# Patient Record
Sex: Female | Born: 1985 | Race: White | Hispanic: No | Marital: Single | Smoking: Never smoker
Health system: Southern US, Community
[De-identification: ages and names within clinical notes are randomized; demographics above are authoritative.]

## PROBLEM LIST (undated history)

## (undated) DIAGNOSIS — I10 Essential (primary) hypertension: Secondary | ICD-10-CM

## (undated) DIAGNOSIS — T7840XA Allergy, unspecified, initial encounter: Secondary | ICD-10-CM

## (undated) HISTORY — DX: Allergy, unspecified, initial encounter: T78.40XA

## (undated) HISTORY — DX: Essential (primary) hypertension: I10

## (undated) HISTORY — PX: FOOT SURGERY: SHX648

## (undated) HISTORY — PX: WRIST SURGERY: SHX841

---

## 2012-10-01 DIAGNOSIS — E669 Obesity, unspecified: Secondary | ICD-10-CM | POA: Insufficient documentation

## 2012-10-01 DIAGNOSIS — Z6841 Body Mass Index (BMI) 40.0 and over, adult: Secondary | ICD-10-CM | POA: Insufficient documentation

## 2017-09-28 ENCOUNTER — Encounter: Payer: Self-pay | Admitting: Physician Assistant

## 2017-09-28 ENCOUNTER — Ambulatory Visit: Payer: Self-pay | Admitting: Physician Assistant

## 2017-09-28 VITALS — BP 119/80 | HR 96 | Temp 98.5°F | Resp 16

## 2017-09-28 DIAGNOSIS — J0100 Acute maxillary sinusitis, unspecified: Secondary | ICD-10-CM

## 2017-09-28 MED ORDER — AZITHROMYCIN 250 MG PO TABS: ORAL_TABLET | ORAL | 0 refills | Status: DC

## 2017-09-28 NOTE — Progress Notes (Signed)
S: Complains of cough and sinus pain for 2 weeks, states she has started film will better but now symptoms have returned. States mucus is dark green. Denies fever or chills, denies chest pain or shortness of breath, did use over-the-counter NyQuil and DayQuil  O: Vitals are normal, TMs are dull, nasal mucosa is red and swollen, throat is normal, neck is supple, no lymphadenopathy noted, lungs are clear to auscultation, heart sounds are normal  A: Acute sinusitis  P: Z-Pak

## 2018-09-20 ENCOUNTER — Ambulatory Visit: Payer: Self-pay | Admitting: Family Medicine

## 2018-09-20 VITALS — BP 128/82 | HR 75 | Temp 98.6°F | Resp 12 | Ht 64.0 in | Wt 230.4 lb

## 2018-09-20 DIAGNOSIS — J069 Acute upper respiratory infection, unspecified: Secondary | ICD-10-CM

## 2018-09-20 DIAGNOSIS — J309 Allergic rhinitis, unspecified: Secondary | ICD-10-CM

## 2018-09-20 MED ORDER — FLUTICASONE PROPIONATE 50 MCG/ACT NA SUSP: 2.0000 | Freq: Every day | NASAL | 0 refills | Status: AC

## 2018-09-20 NOTE — Progress Notes (Signed)
Subjective: Congestion     Maureen Reed is a 32 y.o. female who presents for evaluation of nasal congestion, nonproductive cough, mild fatigue, sneezing, and sore throat for 5 days.  Reports symptoms have been gradually improving since the onset.  Patient denies any medical history other than allergic rhinitis or taking any immunosuppressive medications.  Patient takes an over-the-counter antihistamine as needed for allergies.  Patient denies any difficulty sleeping at night due to cough. Treatment to date: Over-the-counter cold medicine.  Denies rash, nausea, vomiting, diarrhea, SOB, wheezing, chest or back pain, ear pain, difficulty swallowing, confusion, anosmia/hyposmia, dental pain, facial pressure/unilateral facial pain, pain exacerbated by bending over, headache, body aches, fever, chills, severe symptoms, or initial improvement and then worsening of symptoms. History of smoking, asthma, COPD: Negative. History of recurrent sinus and/or lung infections: Negative.  Review of Systems Pertinent items noted in HPI and remainder of comprehensive ROS otherwise negative.     Objective:   Physical Exam General: Awake, alert, and oriented. No acute distress. Well developed, hydrated and nourished. Appears stated age. Nontoxic appearance.  HEENT:  PND noted.  No erythema to posterior oropharynx.  No edema or exudates of pharynx or tonsils. No erythema or bulging of TM.  Mild erythema/edema to nasal mucosa. Sinuses nontender. Supple neck without adenopathy. Cardiac: Heart rate and rhythm are normal. No murmurs, gallops, or rubs are auscultated. S1 and S2 are heard and are of normal intensity.  Respiratory: No signs of respiratory distress. Lungs clear. No tachypnea. Able to speak in full sentences without dyspnea. Nonlabored respirations.  Skin: Skin is warm, dry and intact. Appropriate color for ethnicity. No cyanosis noted.    Assessment:    viral upper respiratory illness   Plan:    Discussed diagnosis and treatment of URI. Discussed the importance of avoiding unnecessary antibiotic therapy. Suggested symptomatic OTC remedies. Nasal saline spray for congestion.   Prescribed Flonase.  Discussed instructions for use and side/adverse effects.  Patient denies any contraindications to use or ocular history. Discussed red flag symptoms and circumstances with which to seek medical care.   New Prescriptions   FLUTICASONE (FLONASE) 50 MCG/ACT NASAL SPRAY    Place 2 sprays into both nostrils daily.

## 2019-03-07 DIAGNOSIS — S52502A Unspecified fracture of the lower end of left radius, initial encounter for closed fracture: Secondary | ICD-10-CM | POA: Insufficient documentation

## 2019-08-25 ENCOUNTER — Other Ambulatory Visit: Payer: Self-pay

## 2019-08-25 DIAGNOSIS — Z20822 Contact with and (suspected) exposure to covid-19: Secondary | ICD-10-CM

## 2019-08-26 LAB — NOVEL CORONAVIRUS, NAA: SARS-CoV-2, NAA: NOT DETECTED

## 2020-03-16 ENCOUNTER — Encounter: Payer: Self-pay | Admitting: Nurse Practitioner

## 2020-03-16 ENCOUNTER — Other Ambulatory Visit: Payer: Self-pay

## 2020-03-16 ENCOUNTER — Ambulatory Visit: Payer: Managed Care, Other (non HMO) | Admitting: Nurse Practitioner

## 2020-03-16 VITALS — BP 122/82 | HR 72 | Temp 97.0°F | Resp 18 | Ht 62.0 in | Wt 234.0 lb

## 2020-03-16 DIAGNOSIS — Z008 Encounter for other general examination: Secondary | ICD-10-CM

## 2020-03-16 NOTE — Progress Notes (Signed)
  Subjective:     [name], female   DOB: Mar 16, 1986, 34 y.o.   MRN: [id number]  HPI  Maureen Reed is a 34 y.o. w/f who presents to the [facility] for her biometric screening exam. She is employed in [employer] and reports she enjoys her job and it is not stressful.   PMH: Seasonal allergies, obesity Surgeries: Left wrist 2021 (fracture) SH: Denies use of tobacco or drugs, occasional ETOH use, employee reports no regular exercise program [place].   GYN: Last Pap 5/20 and was normal  Immunizations: UTD, has had Covid vaccine  Meds: Yasmin, Flonase, Mycolog Allergies: NKDA  Review of Systems  HENT:       Seasonal allergies.   All other systems reviewed and are negative.  BP 122/82 (BP Location: Left Arm, Patient Position: Sitting, Cuff Size: Large)   Pulse 72   Temp (!) 97 F (36.1 C) (Temporal)   Resp 18   Ht 5\' 2"  (1.575 m)   Wt 234 lb (106.1 kg)   SpO2 99%   BMI 42.80 kg/m      Objective:   Physical Exam Vitals and nursing note reviewed.  Constitutional:      Appearance: Normal appearance. She is obese.  HENT:     Head: Normocephalic.     Right Ear: Tympanic membrane, ear canal and external ear normal.     Left Ear: Tympanic membrane, ear canal and external ear normal.     Nose: Nose normal.     Mouth/Throat:     Mouth: Mucous membranes are moist.     Pharynx: Oropharynx is clear.  Eyes:     Conjunctiva/sclera: Conjunctivae normal.  Cardiovascular:     Rate and Rhythm: Normal rate and regular rhythm.     Pulses: Normal pulses.  Pulmonary:     Effort: Pulmonary effort is normal.     Breath sounds: Normal breath sounds.  Musculoskeletal:        General: Normal range of motion.     Cervical back: Neck supple.  Skin:    General: Skin is warm and dry.  Neurological:     Mental Status: She is alert.     Motor: Motor function is intact.     Coordination: Coordination is intact.     Gait: Gait is  intact.     Deep Tendon Reflexes:     Reflex Scores:      Bicep reflexes are 2+ on the right side and 2+ on the left side.      Brachioradialis reflexes are 2+ on the right side and 2+ on the left side.      Patellar reflexes are 2+ on the right side and 2+ on the left side.    Comments: Grips are equal, stands on one foot without difficulty.        Assessment:    34 y.o. obese female here for her biometric screening.   No problems noted today. Normal limited exam.  Plan:     Discussed with the employee importance of healthy diet and exercise. Written and verbal instructions given. Employee given opportunity to ask question. She will continue regular care with her PCP and GYN. She will RTC as needed.

## 2020-03-16 NOTE — Patient Instructions (Addendum)
Healthy Eating Following a healthy eating pattern may help you to achieve and maintain a healthy body weight, reduce the risk of chronic disease, and live a long and productive life. It is important to follow a healthy eating pattern at an appropriate calorie level for your body. Your nutritional needs should be met primarily through food by choosing a variety of nutrient-rich foods. What are tips for following this plan? Reading food labels  Read labels and choose the following: ? Reduced or low sodium. ? Juices with 100% fruit juice. ? Foods with low saturated fats and high polyunsaturated and monounsaturated fats. ? Foods with whole grains, such as whole wheat, cracked wheat, brown rice, and wild rice. ? Whole grains that are fortified with folic acid. This is recommended for women who are pregnant or who want to become pregnant.  Read labels and avoid the following: ? Foods with a lot of added sugars. These include foods that contain brown sugar, corn sweetener, corn syrup, dextrose, fructose, glucose, high-fructose corn syrup, honey, invert sugar, lactose, malt syrup, maltose, molasses, raw sugar, sucrose, trehalose, or turbinado sugar.  Do not eat more than the following amounts of added sugar per day:  6 teaspoons (25 g) for women.  9 teaspoons (38 g) for men. ? Foods that contain processed or refined starches and grains. ? Refined grain products, such as white flour, degermed cornmeal, white bread, and white rice. Shopping  Choose nutrient-rich snacks, such as vegetables, whole fruits, and nuts. Avoid high-calorie and high-sugar snacks, such as potato chips, fruit snacks, and candy.  Use oil-based dressings and spreads on foods instead of solid fats such as butter, stick margarine, or cream cheese.  Limit pre-made sauces, mixes, and "instant" products such as flavored rice, instant noodles, and ready-made pasta.  Try more plant-protein sources, such as tofu, tempeh, black beans,  edamame, lentils, nuts, and seeds.  Explore eating plans such as the Mediterranean diet or vegetarian diet. Cooking  Use oil to saut or stir-fry foods instead of solid fats such as butter, stick margarine, or lard.  Try baking, boiling, grilling, or broiling instead of frying.  Remove the fatty part of meats before cooking.  Steam vegetables in water or broth. Meal planning   At meals, imagine dividing your plate into fourths: ? One-half of your plate is fruits and vegetables. ? One-fourth of your plate is whole grains. ? One-fourth of your plate is protein, especially lean meats, poultry, eggs, tofu, beans, or nuts.  Include low-fat dairy as part of your daily diet. Lifestyle  Choose healthy options in all settings, including home, work, school, restaurants, or stores.  Prepare your food safely: ? Wash your hands after handling raw meats. ? Keep food preparation surfaces clean by regularly washing with hot, soapy water. ? Keep raw meats separate from ready-to-eat foods, such as fruits and vegetables. ? Cook seafood, meat, poultry, and eggs to the recommended internal temperature. ? Store foods at safe temperatures. In general:  Keep cold foods at 59F (4.4C) or below.  Keep hot foods at 159F (60C) or above.  Keep your freezer at [facility] (-17.8C) or below.  Foods are no longer safe to eat when they have been between the temperatures of 40-159F (4.4-60C) for more than 2 hours. What foods should I eat? Fruits Aim to eat 2 cup-equivalents of fresh, canned (in natural juice), or frozen fruits each day. Examples of 1 cup-equivalent of fruit include 1 small apple, 8 large strawberries, 1 cup canned fruit,  cup  dried fruit, or 1 cup 100% juice. Vegetables Aim to eat 2-3 cup-equivalents of fresh and frozen vegetables each day, including different varieties and colors. Examples of 1 cup-equivalent of vegetables include 2 medium carrots, 2 cups raw, leafy greens, 1 cup chopped  vegetable (raw or cooked), or 1 medium baked potato. Grains Aim to eat 6 ounce-equivalents of whole grains each day. Examples of 1 ounce-equivalent of grains include 1 slice of bread, 1 cup ready-to-eat cereal, 3 cups popcorn, or  cup cooked rice, pasta, or cereal. Meats and other proteins Aim to eat 5-6 ounce-equivalents of protein each day. Examples of 1 ounce-equivalent of protein include 1 egg, 1/2 cup nuts or seeds, or 1 tablespoon (16 g) peanut butter. A cut of meat or fish that is the size of a deck of cards is about 3-4 ounce-equivalents.  Of the protein you eat each week, try to have at least 8 ounces come from seafood. This includes salmon, trout, herring, and anchovies. Dairy Aim to eat 3 cup-equivalents of fat-free or low-fat dairy each day. Examples of 1 cup-equivalent of dairy include 1 cup (240 mL) milk, 8 ounces (250 g) yogurt, 1 ounces (44 g) natural cheese, or 1 cup (240 mL) fortified soy milk. Fats and oils  Aim for about 5 teaspoons (21 g) per day. Choose monounsaturated fats, such as canola and olive oils, avocados, peanut butter, and most nuts, or polyunsaturated fats, such as sunflower, corn, and soybean oils, walnuts, pine nuts, sesame seeds, sunflower seeds, and flaxseed. Beverages  Aim for six 8-oz glasses of water per day. Limit coffee to three to five 8-oz cups per day.  Limit caffeinated beverages that have added calories, such as soda and energy drinks.  Limit alcohol intake to no more than 1 drink a day for nonpregnant women and 2 drinks a day for men. One drink equals 12 oz of beer (355 mL), 5 oz of wine (148 mL), or 1 oz of hard liquor (44 mL). Seasoning and other foods  Avoid adding excess amounts of salt to your foods. Try flavoring foods with herbs and spices instead of salt.  Avoid adding sugar to foods.  Try using oil-based dressings, sauces, and spreads instead of solid fats. This information is based on general [place] nutrition guidelines. For more  information, visit [employer]. Exact amounts may vary based on your nutrition needs. Summary  A healthy eating plan may help you to maintain a healthy weight, reduce the risk of chronic diseases, and stay active throughout your life.  Plan your meals. Make sure you eat the right portions of a variety of nutrient-rich foods.  Try baking, boiling, grilling, or broiling instead of frying.  Choose healthy options in all settings, including home, work, school, restaurants, or stores. This information is not intended to replace advice given to you by your health care provider. Make sure you discuss any questions you have with your health care provider. Document Revised: 02/11/2018 Document Reviewed: 02/11/2018 Elsevier Patient Education  [place]. Exercise Safely  Exercise does not have to hurt to be beneficial. Start easy and build up your effort. Exercise at times of the day when you feel your best. Do not hold your breath; remember to breathe out while working and breathe in while relaxing. May also count out loud while exercising. Exercise should not cause joint pain, but rather muscle fatigue.  Copyright  [employer]HI. All rights reserved.

## 2020-03-17 LAB — LIPID PANEL
Chol/HDL Ratio: 2.3 ratio (ref 0.0–4.4)
Cholesterol, Total: 150 mg/dL (ref 100–199)
HDL: 65 mg/dL (ref >39)
LDL Chol Calc (NIH): 69 mg/dL (ref 0–99)
Triglycerides: 84 mg/dL (ref 0–149)
VLDL Cholesterol Cal: 16 mg/dL (ref 5–40)

## 2020-03-17 LAB — GLUCOSE, RANDOM: Glucose: 92 mg/dL (ref 65–99)

## 2020-06-01 ENCOUNTER — Encounter: Payer: Self-pay | Admitting: Adult Health

## 2020-06-01 ENCOUNTER — Ambulatory Visit (INDEPENDENT_AMBULATORY_CARE_PROVIDER_SITE_OTHER): Payer: Managed Care, Other (non HMO) | Admitting: Adult Health

## 2020-06-01 ENCOUNTER — Other Ambulatory Visit: Payer: Self-pay

## 2020-06-01 VITALS — BP 130/80 | HR 92 | Temp 96.6°F | Resp 16 | Ht 62.5 in | Wt 233.6 lb

## 2020-06-01 DIAGNOSIS — Z0000 Encounter for general adult medical examination without abnormal findings: Secondary | ICD-10-CM | POA: Diagnosis not present

## 2020-06-01 DIAGNOSIS — Z6841 Body Mass Index (BMI) 40.0 and over, adult: Secondary | ICD-10-CM

## 2020-06-01 DIAGNOSIS — Z1389 Encounter for screening for other disorder: Secondary | ICD-10-CM

## 2020-06-01 DIAGNOSIS — E559 Vitamin D deficiency, unspecified: Secondary | ICD-10-CM | POA: Diagnosis not present

## 2020-06-01 DIAGNOSIS — N6310 Unspecified lump in the right breast, unspecified quadrant: Secondary | ICD-10-CM

## 2020-06-01 DIAGNOSIS — R21 Rash and other nonspecific skin eruption: Secondary | ICD-10-CM | POA: Diagnosis not present

## 2020-06-01 DIAGNOSIS — Z9889 Other specified postprocedural states: Secondary | ICD-10-CM

## 2020-06-01 DIAGNOSIS — Z803 Family history of malignant neoplasm of breast: Secondary | ICD-10-CM

## 2020-06-01 MED ORDER — KETOCONAZOLE 2 % EX CREA: 1.0000 "application " | TOPICAL_CREAM | Freq: Every day | CUTANEOUS | 0 refills | Status: DC

## 2020-06-01 NOTE — Patient Instructions (Addendum)
[facility] at [facility] [address] [place], [place] [address]  Main: [phone]     Health Maintenance, Female Adopting a healthy lifestyle and getting preventive care are important in promoting health and wellness. Ask your health care provider about:  The right schedule for you to have regular tests and exams.  Things you can do on your own to prevent diseases and keep yourself healthy. What should I know about diet, weight, and exercise? Eat a healthy diet   Eat a diet that includes plenty of vegetables, fruits, low-fat dairy products, and lean protein.  Do not eat a lot of foods that are high in solid fats, added sugars, or sodium. Maintain a healthy weight Body mass index (BMI) is used to identify weight problems. It estimates body fat based on height and weight. Your health care provider can help determine your BMI and help you achieve or maintain a healthy weight. Get regular exercise Get regular exercise. This is one of the most important things you can do for your health. Most adults should:  Exercise for at least 150 minutes each week. The exercise should increase your heart rate and make you sweat (moderate-intensity exercise).  Do strengthening exercises at least twice a week. This is in addition to the moderate-intensity exercise.  Spend less time sitting. Even light physical activity can be beneficial. Watch cholesterol and blood lipids Have your blood tested for lipids and cholesterol at 32 years of age, then have this test every 5 years. Have your cholesterol levels checked more often if:  Your lipid or cholesterol levels are high.  You are older than 32 years of age.  You are at high risk for heart disease. What should I know about cancer screening? Depending on your health history and family history, you may need to have cancer screening at various ages. This may include screening for:  Breast cancer.  Cervical  cancer.  Colorectal cancer.  Skin cancer.  Lung cancer. What should I know about heart disease, diabetes, and high blood pressure? Blood pressure and heart disease  High blood pressure causes heart disease and increases the risk of stroke. This is more likely to develop in people who have high blood pressure readings, are of African descent, or are overweight.  Have your blood pressure checked: ? Every 3-5 years if you are 78-41 years of age. ? Every year if you are 68 years old or older. Diabetes Have regular diabetes screenings. This checks your fasting blood sugar level. Have the screening done:  Once every three years after age 63 if you are at a normal weight and have a low risk for diabetes.  More often and at a younger age if you are overweight or have a high risk for diabetes. What should I know about preventing infection? Hepatitis B If you have a higher risk for hepatitis B, you should be screened for this virus. Talk with your health care provider to find out if you are at risk for hepatitis B infection. Hepatitis C Testing is recommended for:  Everyone born from [birth year] through 1963.  Anyone with known risk factors for hepatitis C. Sexually transmitted infections (STIs)  Get screened for STIs, including gonorrhea and chlamydia, if: ? You are sexually active and are younger than 32 years of age. ? You are older than 32 years of age and your health care provider tells you that you are at risk for this type of infection. ? Your sexual activity has  changed since you were last screened, and you are at increased risk for chlamydia or gonorrhea. Ask your health care provider if you are at risk.  Ask your health care provider about whether you are at high risk for HIV. Your health care provider may recommend a prescription medicine to help prevent HIV infection. If you choose to take medicine to prevent HIV, you should first get tested for HIV. You should then be tested every 3  months for as long as you are taking the medicine. Pregnancy  If you are about to stop having your period (premenopausal) and you may become pregnant, seek counseling before you get pregnant.  Take 400 to 800 micrograms (mcg) of folic acid every day if you become pregnant.  Ask for birth control (contraception) if you want to prevent pregnancy. Osteoporosis and menopause Osteoporosis is a disease in which the bones lose minerals and strength with aging. This can result in bone fractures. If you are 32 years old or older, or if you are at risk for osteoporosis and fractures, ask your health care provider if you should:  Be screened for bone loss.  Take a calcium or vitamin D supplement to lower your risk of fractures.  Be given hormone replacement therapy (HRT) to treat symptoms of menopause. Follow these instructions at home: Lifestyle  Do not use any products that contain nicotine or tobacco, such as cigarettes, e-cigarettes, and chewing tobacco. If you need help quitting, ask your health care provider.  Do not use street drugs.  Do not share needles.  Ask your health care provider for help if you need support or information about quitting drugs. Alcohol use  Do not drink alcohol if: ? Your health care provider tells you not to drink. ? You are pregnant, may be pregnant, or are planning to become pregnant.  If you drink alcohol: ? Limit how much you use to 0-1 drink a day. ? Limit intake if you are breastfeeding.  Be aware of how much alcohol is in your drink. In the [place] one drink equals one 12 oz bottle of beer (355 mL), one 5 oz glass of wine (148 mL), or one 1 oz glass of hard liquor (44 mL). General instructions  Schedule regular health, dental, and eye exams.  Stay current with your vaccines.  Tell your health care provider if: ? You often feel depressed. ? You have ever been abused or do not feel safe at home. Summary  Adopting a healthy lifestyle and getting  preventive care are important in promoting health and wellness.  Follow your health care provider's instructions about healthy diet, exercising, and getting tested or screened for diseases.  Follow your health care provider's instructions on monitoring your cholesterol and blood pressure. This information is not intended to replace advice given to you by your health care provider. Make sure you discuss any questions you have with your health care provider. Document Revised: 10/23/2018 Document Reviewed: 10/23/2018 Elsevier Patient Education  2020 Elsevier Inc.  Breast Self-Awareness Breast self-awareness is knowing how your breasts look and feel. Doing breast self-awareness is important. It allows you to catch a breast problem early while it is still small and can be treated. All women should do breast self-awareness, including women who have had breast implants. Tell your doctor if you notice a change in your breasts. What you need:  A mirror.  A well-lit room. How to do a breast self-exam A breast self-exam is one way to learn what is normal  for your breasts and to check for changes. To do a breast self-exam: Look for changes  1. Take off all the clothes above your waist. 2. Stand in front of a mirror in a room with good lighting. 3. Put your hands on your hips. 4. Push your hands down. 5. Look at your breasts and nipples in the mirror to see if one breast or nipple looks different from the other. Check to see if: ? The shape of one breast is different. ? The size of one breast is different. ? There are wrinkles, dips, and bumps in one breast and not the other. 6. Look at each breast for changes in the skin, such as: ? Redness. ? Scaly areas. 7. Look for changes in your nipples, such as: ? Liquid around the nipples. ? Bleeding. ? Dimpling. ? Redness. ? A change in where the nipples are. Feel for changes  1. Lie on your back on the floor. 2. Feel each breast. To do this,  follow these steps: ? Pick a breast to feel. ? Put the arm closest to that breast above your head. ? Use your other arm to feel the nipple area of your breast. Feel the area with the pads of your three middle fingers by making small circles with your fingers. For the first circle, press lightly. For the second circle, press harder. For the third circle, press even harder. ? Keep making circles with your fingers at the different pressures as you move down your breast. Stop when you feel your ribs. ? Move your fingers a little toward the center of your body. ? Start making circles with your fingers again, this time going up until you reach your collarbone. ? Keep making up-and-down circles until you reach your armpit. Remember to keep using the three pressures. ? Feel the other breast in the same way. 3. Sit or stand in the tub or shower. 4. With soapy water on your skin, feel each breast the same way you did in step 2 when you were lying on the floor. Write down what you find Writing down what you find can help you remember what to tell your doctor. Write down:  What is normal for each breast.  Any changes you find in each breast, including: ? The kind of changes you find. ? Whether you have pain. ? Size and location of any lumps.  When you last had your menstrual period. General tips  Check your breasts every month.  If you are breastfeeding, the best time to check your breasts is after you feed your baby or after you use a breast pump.  If you get menstrual periods, the best time to check your breasts is 5-7 days after your menstrual period is over.  With time, you will become comfortable with the self-exam, and you will begin to know if there are changes in your breasts. Contact a doctor if you:  See a change in the shape or size of your breasts or nipples.  See a change in the skin of your breast or nipples, such as red or scaly skin.  Have fluid coming from your nipples that  is not normal.  Find a lump or thick area that was not there before.  Have pain in your breasts.  Have any concerns about your breast health. Summary  Breast self-awareness includes looking for changes in your breasts, as well as feeling for changes within your breasts.  Breast self-awareness should be done in front of  a mirror in a well-lit room.  You should check your breasts every month. If you get menstrual periods, the best time to check your breasts is 5-7 days after your menstrual period is over.  Let your doctor know of any changes you see in your breasts, including changes in size, changes on the skin, pain or tenderness, or fluid from your nipples that is not normal. This information is not intended to replace advice given to you by your health care provider. Make sure you discuss any questions you have with your health care provider. Document Revised: 06/18/2018 Document Reviewed: 06/18/2018 Elsevier Patient Education  2020 Else[employer] Inc.   Calorie Counting for [employer] Calories are units of energy. Your body needs a certain amount of calories from food to keep you going throughout the day. When you eat more calories than your body needs, your body stores the extra calories as fat. When you eat fewer calories than your body needs, your body burns fat to get the energy it needs. Calorie counting means keeping track of how many calories you eat and drink each day. Calorie counting can be helpful if you need to lose weight. If you make sure to eat fewer calories than your body needs, you should lose weight. Ask your health care provider what a healthy weight is for you. For calorie counting to work, you will need to eat the right number of calories in a day in order to lose a healthy amount of weight per week. A dietitian can help you determine how many calories you need in a day and will give you suggestions on how to reach your calorie goal.  A healthy amount of weight to lose  per week is usually 1-2 lb (0.5-0.9 kg). This usually means that your daily calorie intake should be reduced by 500-750 calories.  Eating 1,200 - 1,500 calories per day can help most women lose weight.  Eating 1,500 - 1,800 calories per day can help most men lose weight. What is my plan? My goal is to have __________ calories per day. If I have this many calories per day, I should lose around __________ pounds per week. What do I need to know about calorie counting? In order to meet your daily calorie goal, you will need to:  Find out how many calories are in each food you would like to eat. Try to do this before you eat.  Decide how much of the food you plan to eat.  Write down what you ate and how many calories it had. Doing this is called keeping a food log. To successfully lose weight, it is important to balance calorie counting with a healthy lifestyle that includes regular activity. Aim for 150 minutes of moderate exercise (such as walking) or 75 minutes of vigorous exercise (such as running) each week. Where do I find calorie information?  The number of calories in a food can be found on a Nutrition Facts label. If a food does not have a Nutrition Facts label, try to look up the calories online or ask your dietitian for help. Remember that calories are listed per serving. If you choose to have more than one serving of a food, you will have to multiply the calories per serving by the amount of servings you plan to eat. For example, the label on a package of bread might say that a serving size is 1 slice and that there are 90 calories in a serving. If you eat 1 slice,  you will have eaten 90 calories. If you eat 2 slices, you will have eaten 180 calories. How do I keep a food log? Immediately after each meal, record the following information in your food log:  What you ate. Don't forget to include toppings, sauces, and other extras on the food.  How much you ate. This can be measured in  cups, ounces, or number of items.  How many calories each food and drink had.  The total number of calories in the meal. Keep your food log near you, such as in a small notebook in your pocket, or use a mobile app or website. Some programs will calculate calories for you and show you how many calories you have left for the day to meet your goal. What are some calorie counting tips?   Use your calories on foods and drinks that will fill you up and not leave you hungry: ? Some examples of foods that fill you up are nuts and nut butters, vegetables, lean proteins, and high-fiber foods like whole grains. High-fiber foods are foods with more than 5 g fiber per serving. ? Drinks such as sodas, specialty coffee drinks, alcohol, and juices have a lot of calories, yet do not fill you up.  Eat nutritious foods and avoid empty calories. Empty calories are calories you get from foods or beverages that do not have many vitamins or protein, such as candy, sweets, and soda. It is better to have a nutritious high-calorie food (such as an avocado) than a food with few nutrients (such as a bag of chips).  Know how many calories are in the foods you eat most often. This will help you calculate calorie counts faster.  Pay attention to calories in drinks. Low-calorie drinks include water and unsweetened drinks.  Pay attention to nutrition labels for "low fat" or "fat free" foods. These foods sometimes have the same amount of calories or more calories than the full fat versions. They also often have added sugar, starch, or salt, to make up for flavor that was removed with the fat.  Find a way of tracking calories that works for you. Get creative. Try different apps or programs if writing down calories does not work for you. What are some portion control tips?  Know how many calories are in a serving. This will help you know how many servings of a certain food you can have.  Use a measuring cup to measure serving  sizes. You could also try weighing out portions on a kitchen scale. With time, you will be able to estimate serving sizes for some foods.  Take some time to put servings of different foods on your favorite plates, bowls, and cups so you know what a serving looks like.  Try not to eat straight from a bag or box. Doing this can lead to overeating. Put the amount you would like to eat in a cup or on a plate to make sure you are eating the right portion.  Use smaller plates, glasses, and bowls to prevent overeating.  Try not to multitask (for example, watch TV or use your computer) while eating. If it is time to eat, sit down at a table and enjoy your food. This will help you to know when you are full. It will also help you to be aware of what you are eating and how much you are eating. What are tips for following this plan? Reading food labels  Check the calorie count compared to the  serving size. The serving size may be smaller than what you are used to eating.  Check the source of the calories. Make sure the food you are eating is high in vitamins and protein and low in saturated and trans fats. Shopping  Read nutrition labels while you shop. This will help you make healthy decisions before you decide to purchase your food.  Make a grocery list and stick to it. Cooking  Try to cook your favorite foods in a healthier way. For example, try baking instead of frying.  Use low-fat dairy products. Meal planning  Use more fruits and vegetables. Half of your plate should be fruits and vegetables.  Include lean proteins like poultry and fish. How do I count calories when eating out?  Ask for smaller portion sizes.  Consider sharing an entree and sides instead of getting your own entree.  If you get your own entree, eat only half. Ask for a box at the beginning of your meal and put the rest of your entree in it so you are not tempted to eat it.  If calories are listed on the menu, choose  the lower calorie options.  Choose dishes that include vegetables, fruits, whole grains, low-fat dairy products, and lean protein.  Choose items that are boiled, broiled, grilled, or steamed. Stay away from items that are buttered, battered, fried, or served with cream sauce. Items labeled "crispy" are usually fried, unless stated otherwise.  Choose water, low-fat milk, unsweetened iced tea, or other drinks without added sugar. If you want an alcoholic beverage, choose a lower calorie option such as a glass of wine or light beer.  Ask for dressings, sauces, and syrups on the side. These are usually high in calories, so you should limit the amount you eat.  If you want a salad, choose a garden salad and ask for grilled meats. Avoid extra toppings like bacon, cheese, or fried items. Ask for the dressing on the side, or ask for olive oil and vinegar or lemon to use as dressing.  Estimate how many servings of a food you are given. For example, a serving of cooked rice is  cup or about the size of half a baseball. Knowing serving sizes will help you be aware of how much food you are eating at restaurants. The list below tells you how big or small some common portion sizes are based on everyday objects: ? 1 oz--4 stacked dice. ? 3 oz--1 deck of cards. ? 1 tsp--1 die. ? 1 Tbsp-- a ping-pong ball. ? 2 Tbsp--1 ping-pong ball. ?  cup-- baseball. ? 1 cup--1 baseball. Summary  Calorie counting means keeping track of how many calories you eat and drink each day. If you eat fewer calories than your body needs, you should lose weight.  A healthy amount of weight to lose per week is usually 1-2 lb (0.5-0.9 kg). This usually means reducing your daily calorie intake by 500-750 calories.  The number of calories in a food can be found on a Nutrition Facts label. If a food does not have a Nutrition Facts label, try to look up the calories online or ask your dietitian for help.  Use your calories on foods  and drinks that will fill you up, and not on foods and drinks that will leave you hungry.  Use smaller plates, glasses, and bowls to prevent overeating. This information is not intended to replace advice given to you by your health care provider. Make sure you discuss any  questions you have with your health care provider. Document Revised: 07/19/2018 Document Reviewed: 09/29/2016 Elsevier Patient Education  2020 Elsevier Inc.   Fat and Cholesterol Restricted Eating Plan Getting too much fat and cholesterol in your diet may cause health problems. Choosing the right foods helps keep your fat and cholesterol at normal levels. This can keep you from getting certain diseases. Your doctor may recommend an eating plan that includes:  Total fat: ______% or less of total calories a day.  Saturated fat: ______% or less of total calories a day.  Cholesterol: less than _________mg a day.  Fiber: ______g a day. What are tips for following this plan? Meal planning  At meals, divide your plate into four equal parts: ? Fill one-half of your plate with vegetables and green salads. ? Fill one-fourth of your plate with whole grains. ? Fill one-fourth of your plate with low-fat (lean) protein foods.  Eat fish that is high in omega-3 fats at least two times a week. This includes mackerel, tuna, sardines, and salmon.  Eat foods that are high in fiber, such as whole grains, beans, apples, broccoli, carrots, peas, and barley. General tips   Work with your doctor to lose weight if you need to.  Avoid: ? Foods with added sugar. ? Fried foods. ? Foods with partially hydrogenated oils.  Limit alcohol intake to no more than 1 drink a day for nonpregnant women and 2 drinks a day for men. One drink equals 12 oz of beer, 5 oz of wine, or 1 oz of hard liquor. Reading food labels  Check food labels for: ? Trans fats. ? Partially hydrogenated oils. ? Saturated fat (g) in each serving. ? Cholesterol  (mg) in each serving. ? Fiber (g) in each serving.  Choose foods with healthy fats, such as: ? Monounsaturated fats. ? Polyunsaturated fats. ? Omega-3 fats.  Choose grain products that have whole grains. Look for the word "whole" as the first word in the ingredient list. Cooking  Cook foods using low-fat methods. These include baking, boiling, grilling, and broiling.  Eat more home-cooked foods. Eat at restaurants and buffets less often.  Avoid cooking using saturated fats, such as butter, cream, palm oil, palm kernel oil, and coconut oil. Recommended foods  Fruits  All fresh, canned (in natural juice), or frozen fruits. Vegetables  Fresh or frozen vegetables (raw, steamed, roasted, or grilled). Green salads. Grains  Whole grains, such as whole wheat or whole grain breads, crackers, cereals, and pasta. Unsweetened oatmeal, bulgur, barley, quinoa, or brown rice. Corn or whole wheat flour tortillas. Meats and other protein foods  Ground beef (85% or leaner), grass-fed beef, or beef trimmed of fat. Skinless chicken or [place]. Ground chicken or [place]. Pork trimmed of fat. All fish and seafood. Egg whites. Dried beans, peas, or lentils. Unsalted nuts or seeds. Unsalted canned beans. Nut butters without added sugar or oil. Dairy  Low-fat or nonfat dairy products, such as skim or 1% milk, 2% or reduced-fat cheeses, low-fat and fat-free ricotta or cottage cheese, or plain low-fat and nonfat yogurt. Fats and oils  Tub margarine without trans fats. Light or reduced-fat mayonnaise and salad dressings. Avocado. Olive, canola, sesame, or safflower oils. The items listed above may not be a complete list of foods and beverages you can eat. Contact a dietitian for more information. Foods to avoid Fruits  Canned fruit in heavy syrup. Fruit in cream or butter sauce. Fried fruit. Vegetables  Vegetables cooked in cheese, cream, or butter sauce.  Fried vegetables. Grains  White bread. White  pasta. White rice. Cornbread. Bagels, pastries, and croissants. Crackers and snack foods that contain trans fat and hydrogenated oils. Meats and other protein foods  Fatty cuts of meat. Ribs, chicken wings, bacon, sausage, bologna, salami, chitterlings, fatback, hot dogs, bratwurst, and packaged lunch meats. Liver and organ meats. Whole eggs and egg yolks. Chicken and [place] with skin. Fried meat. Dairy  Whole or 2% milk, cream, half-and-half, and cream cheese. Whole milk cheeses. Whole-fat or sweetened yogurt. Full-fat cheeses. Nondairy creamers and whipped toppings. Processed cheese, cheese spreads, and cheese curds. Beverages  Alcohol. Sugar-sweetened drinks such as sodas, lemonade, and fruit drinks. Fats and oils  Butter, stick margarine, lard, shortening, ghee, or bacon fat. Coconut, palm kernel, and palm oils. Sweets and desserts  Corn syrup, sugars, honey, and molasses. Candy. Jam and jelly. Syrup. Sweetened cereals. Cookies, pies, cakes, donuts, muffins, and ice cream. The items listed above may not be a complete list of foods and beverages you should avoid. Contact a dietitian for more information. Summary  Choosing the right foods helps keep your fat and cholesterol at normal levels. This can keep you from getting certain diseases.  At meals, fill one-half of your plate with vegetables and green salads.  Eat high-fiber foods, like whole grains, beans, apples, carrots, peas, and barley.  Limit added sugar, saturated fats, alcohol, and fried foods. This information is not intended to replace advice given to you by your health care provider. Make sure you discuss any questions you have with your health care provider. Document Revised: 07/03/2018 Document Reviewed: 07/17/2017 Elsevier Patient Education  2020 [employer].

## 2020-06-01 NOTE — Progress Notes (Signed)
New patient visit   Patient: Maureen Reed   DOB: 01-08-1986   34 y.o. Female  MRN: [id number] Visit Date: 06/01/2020  Today's healthcare provider: Jairo Ben, FNP   Chief Complaint  Patient presents with  . New Patient (Initial Visit)   Subjective    Maureen Reed is a 34 y.o. female who presents today as a new patient to establish care.  HPI  Patient comes to our practice from [facility], patient reports that she feels well today and has no concerns or complaints to address. Patient states that she follows a well balanced diet and is staying active by walking, patient reports sleep patterns are good and states that she sleeps on average 7hrs a night Patient  denies any fever, body aches,chills, rash, chest pain, shortness of breath, nausea, vomiting, or diarrhea.   PAP smear was within normal 2019. - have records.  denies any STD testing.   LMP 05/28/2020.    right breast area present since 2021, January possible. Does not itch or burn, denies any pain. Denies any nipple discharge.  Mother with breast cancer at 23 years old was Stage 1.  Aunt with breast cancer age 43's.  Grandfather - masectomy- no ideas on age or diagnosis.   Last Reported pap 12/06/2018- normal, negative HPV screen   Denies dizziness, lightheadedness, pre syncopal or syncopal episodes.   Past Medical History:  Diagnosis Date  . Allergy    Past Surgical History:  Procedure Laterality Date  . WRIST SURGERY Left    2020   Hardware in left wrist from surgery.  Family Status  Relation Name Status  . Mother  (Not Specified)  . Father  (Not Specified)  . Mat Aunt  (Not Specified)  . Emelda Brothers  Other       written in comments on patients medical history paitent wrote [employer]  . MGM  (Not Specified)  . MGF  (Not Specified)  . PGM  (Not Specified)  . PGF  (Not Specified)   Family History  Problem Relation Age of Onset  . Hypertension Mother   . Breast cancer Mother   .  Skin cancer Mother   . Diabetes Mellitus II Mother   . Atrial fibrillation Mother   . Asthma Father   . Hypertension Maternal Aunt   . Breast cancer Maternal Aunt   . Ulcerative colitis Paternal Aunt   . Diabetes Mellitus II Paternal Aunt   . Cataracts Paternal Aunt   . Anemia Paternal Aunt   . Parkinson's disease Paternal Aunt   . Hypertension Paternal Aunt   . Hyperlipidemia Paternal Aunt   . Fibroids Paternal Aunt   . Other Paternal Aunt   . Heart disease Maternal Grandmother   . Stroke Maternal Grandmother   . Esophageal cancer Maternal Grandfather   . Liver cancer Maternal Grandfather   . Hyperlipidemia Maternal Grandfather   . Hypertension Maternal Grandfather   . Parkinson's disease Paternal Grandmother   . Anemia Paternal Grandmother   . Hypotension Paternal Grandmother   . Cataracts Paternal Grandmother   . Breast cancer Paternal Grandfather   . Diabetes Mellitus II Paternal Grandfather   . Heart disease Paternal Grandfather   . Liver cancer Paternal Grandfather    Social History   Socioeconomic History  . Marital status: Single    Spouse name: Not on file  . Number of children: Not on file  . Years of education: Not on file  . Highest education level: Not  on file  Occupational History  . Not on file  Tobacco Use  . Smoking status: Never Smoker  . Smokeless tobacco: Never Used  Substance and Sexual Activity  . Alcohol use: Yes    Alcohol/week: 1.0 standard drink    Types: 1 Glasses of wine per week  . Drug use: Never  . Sexual activity: Not Currently  Other Topics Concern  . Not on file  Social History Narrative  . Not on file   Social Determinants of Health   Financial Resource Strain:   . Difficulty of Paying Living Expenses:   Food Insecurity:   . Worried About Programme researcher, broadcasting/film/video in the Last Year:   . Barista in the Last Year:   Transportation Needs:   . Freight forwarder (Medical):   Marland Kitchen Lack of Transportation (Non-Medical):     Physical Activity:   . Days of Exercise per Week:   . Minutes of Exercise per Session:   Stress:   . Feeling of Stress :   Social Connections:   . Frequency of Communication with Friends and Family:   . Frequency of Social Gatherings with Friends and Family:   . Attends Religious Services:   . Active Member of Clubs or Organizations:   . Attends Banker Meetings:   Marland Kitchen Marital Status:    Outpatient Medications Prior to Visit  Medication Sig  . fluticasone (FLONASE) 50 MCG/ACT nasal spray Place 2 sprays into both nostrils daily.  . [DISCONTINUED] nystatin-triamcinolone ointment (MYCOLOG) Apply topically 2 (two) times daily.  . drospirenone-ethinyl estradiol (YASMIN,ZARAH,SYEDA) 3-0.03 MG tablet Take by mouth.   No facility-administered medications prior to visit.   Allergies  Allergen Reactions  . Other Other (See Comments)    Seasonal    Immunization History  Administered Date(s) Administered  . Tdap 11/13/2008, 05/30/2019    Health Maintenance  Topic Date Due  . PAP SMEAR-Modifier  Never done  . COVID-19 Vaccine (1) 06/18/2020 (Originally 03/06/2000)  . Hepatitis C Screening  06/02/2021 (Originally 1987[birth year]12/19)  . HIV Screening  06/02/2021 (Originally 03/07/2003)  . INFLUENZA VACCINE  06/13/2020  . TETANUS/TDAP  05/29/2029    Patient Care Team: Berniece Pap, FNP as PCP - General (Family Medicine)  Review of Systems  Allergic/Immunologic: Positive for environmental allergies.  All other systems reviewed and are negative.      Objective    BP 130/80   Pulse 92   Temp (!) 96.6 F (35.9 C) (Oral)   Resp 16   Ht 5' 2.5" (1.588 m)   Wt 233 lb 9.6 oz (106 kg)   LMP 05/28/2020 (Exact Date)   SpO2 99%   BMI 42.05 kg/m  Physical Exam Vitals reviewed.  Constitutional:      General: She is not in acute distress.    Appearance: Normal appearance. She is well-developed and normal weight. She is not ill-appearing, toxic-appearing or diaphoretic.      Interventions: She is not intubated.    Comments: Patient is alert and oriented and responsive to questions Engages in eye contact with provider. Speaks in full sentences without any pauses without any shortness of breath or distress.    HENT:     Head: Normocephalic and atraumatic.     Right Ear: Tympanic membrane, ear canal and external ear normal. There is no impacted cerumen.     Left Ear: Tympanic membrane, ear canal and external ear normal. There is no impacted cerumen.     Nose: Nose  normal. No congestion or rhinorrhea.     Mouth/Throat:     Mouth: Mucous membranes are moist.     Pharynx: No oropharyngeal exudate or posterior oropharyngeal erythema.  Eyes:     General: Lids are normal. No scleral icterus.       Right eye: No discharge.        Left eye: No discharge.     Conjunctiva/sclera: Conjunctivae normal.     Right eye: Right conjunctiva is not injected. No exudate or hemorrhage.    Left eye: Left conjunctiva is not injected. No exudate or hemorrhage.    Pupils: Pupils are equal, round, and reactive to light.  Neck:     Thyroid: No thyroid mass or thyromegaly.     Vascular: Normal carotid pulses. No carotid bruit, hepatojugular reflux or JVD.     Trachea: Trachea and phonation normal. No tracheal tenderness or tracheal deviation.     Meningeal: Brudzinski's sign and Kernig's sign absent.  Cardiovascular:     Rate and Rhythm: Normal rate and regular rhythm.     Pulses: Normal pulses.          Radial pulses are 2+ on the right side and 2+ on the left side.       Dorsalis pedis pulses are 2+ on the right side and 2+ on the left side.       Posterior tibial pulses are 2+ on the right side and 2+ on the left side.     Heart sounds: Normal heart sounds, S1 normal and S2 normal. Heart sounds not distant. No murmur heard.  No friction rub. No gallop.   Pulmonary:     Effort: Pulmonary effort is normal. No tachypnea, bradypnea, accessory muscle usage or respiratory distress.  She is not intubated.     Breath sounds: Normal breath sounds. No stridor. No wheezing, rhonchi or rales.  Chest:     Chest wall: No mass or tenderness.     Breasts: Tanner Score is 5. Breasts are symmetrical.        Right: Mass (likely cyst at 3 pm on right breast near nipple  other scattered cysts throughout ) and skin change (eryythematous superficial  2 cm area / possible rash as on diagram of right breast 11pm to 12pm. ) present. No swelling, nipple discharge or tenderness.        Left: No swelling, bleeding, mass, nipple discharge or tenderness.       Comments: left breast with scattered soft mobile non tender small nodules scattered.  Abdominal:     General: Bowel sounds are normal. There is no distension or abdominal bruit.     Palpations: Abdomen is soft. There is no shifting dullness, fluid wave, hepatomegaly, splenomegaly, mass or pulsatile mass.     Tenderness: There is no abdominal tenderness. There is no right CVA tenderness, left CVA tenderness, guarding or rebound.     Hernia: No hernia is present.  Musculoskeletal:        General: No swelling, tenderness, deformity or signs of injury. Normal range of motion.     Cervical back: Full passive range of motion without pain, normal range of motion and neck supple. No edema, erythema, rigidity or tenderness. No spinous process tenderness or muscular tenderness. Normal range of motion.     Right lower leg: No edema.     Left lower leg: No edema.  Lymphadenopathy:     Head:     Right side of head: No submental, submandibular, tonsillar, preauricular,  posterior auricular or occipital adenopathy.     Left side of head: No submental, submandibular, tonsillar, preauricular, posterior auricular or occipital adenopathy.     Cervical: No cervical adenopathy.     Right cervical: No superficial, deep or posterior cervical adenopathy.    Left cervical: No superficial, deep or posterior cervical adenopathy.     Upper Body:     Right upper  body: No supraclavicular or pectoral adenopathy.     Left upper body: No supraclavicular or pectoral adenopathy.  Skin:    General: Skin is warm and dry.     Capillary Refill: Capillary refill takes less than 2 seconds.     Coloration: Skin is not jaundiced or pale.     Findings: No abrasion, bruising, burn, ecchymosis, erythema, lesion, petechiae or rash.     Nails: There is no clubbing.  Neurological:     Mental Status: She is alert and oriented to person, place, and time.     GCS: GCS eye subscore is 4. GCS verbal subscore is 5. GCS motor subscore is 6.     Cranial Nerves: No cranial nerve deficit.     Sensory: No sensory deficit.     Motor: No weakness, tremor, atrophy, abnormal muscle tone or seizure activity.     Coordination: Coordination normal.     Gait: Gait normal.     Deep Tendon Reflexes: Reflexes are normal and symmetric. Reflexes normal. Babinski sign absent on the right side. Babinski sign absent on the left side.     Reflex Scores:      Tricep reflexes are 2+ on the right side and 2+ on the left side.      Bicep reflexes are 2+ on the right side and 2+ on the left side.      Brachioradialis reflexes are 2+ on the right side and 2+ on the left side.      Patellar reflexes are 2+ on the right side and 2+ on the left side.      Achilles reflexes are 2+ on the right side and 2+ on the left side. Psychiatric:        Mood and Affect: Mood normal.        Speech: Speech normal.        Behavior: Behavior normal.        Thought Content: Thought content normal.        Judgment: Judgment normal.     Depression Screen PHQ 2/9 Scores 06/01/2020  PHQ - 2 Score 0  PHQ- 9 Score 1   No results found for any visits on 06/01/20.  Assessment & Plan      The primary encounter diagnosis was Routine health maintenance. Diagnoses of Screening for blood or protein in urine, Vitamin D , Skin rash, Breast mass, right, Family history of breast cancer, H/O left wrist surgery- with  hardware. , and Body mass index (BMI) of 40.1-44.9 in adult [facility](HCC) were also pertinent to this visit.   Orders Placed This Encounter  Procedures  . MS DIGITAL DIAG TOMO BILAT    Standing Status:   Future    Standing Expiration Date:   06/02/2021    Order Specific Question:   Reason for Exam (SYMPTOM  OR DIAGNOSIS REQUIRED)    Answer:   likely cyst at 3 pm on right breast near nipple  other scattered cysts throughout / left breast with scattered mobile nocules throughout suspect cystic- family history of female and female breast cancer in familiy.  Order Specific Question:   Is the patient pregnant?    Answer:   No    Order Specific Question:   Preferred imaging location?    Answer:   [facility]  . US BREAST LTD UNI RIGHT INC AXILLA    Standing Status:   Future    Standing Expiration Date:   06/02/2021    Order Specific Question:   Reason for Exam (SYMPTOM  OR DIAGNOSIS REQUIRED)    Answer:   likely cyst at 3 pm on right breast near nipple  other scattered cysts throughout / scatterd mobile small nodules throughout left breast    Order Specific Question:   Preferred imaging location?    Answer:   [facility]  . US BREAST LTD UNI LEFT INC AXILLA    Order Specific Question:   Reason for Exam (SYMPTOM  OR DIAGNOSIS REQUIRED)    Answer:   likely cyst at 3 pm on right breast near nipple  other scattered cysts throughout / scatterd mobile small nodules throughout left breast    Order Specific Question:   Preferred imaging location?    Answer:   [facility]  . TSH  . CBC with Differential/Platelet  . Comprehensive Metabolic Panel (CMET)  . VITAMIN D 25 Hydroxy (Vit-D Deficiency, Fractures)  . POCT urinalysis dipstick   Apply to rash on right breast .  Meds ordered this encounter  Medications  . ketoconazole (NIZORAL) 2 % cream    Sig: Apply 1 application topically daily.    Dispense:  15 g    Refill:  0   The patient is advised to follow a low fat, low cholesterol  diet, attempt to lose weight, use calcium 1 gram daily with Vit D, continue current medications, continue current healthy lifestyle patterns and return for routine annual checkups.   Medications Discontinued During This Encounter  Medication Reason  . nystatin-triamcinolone ointment (MYCOLOG) Completed Course   Return if symptoms worsen or fail to improve, for at any time for any worsening symptoms, Go to Emergency room/ urgent care if worse.      Red Flags discussed. The patient was given clear instructions to go to ER or return to medical center if any red flags develop, symptoms do not improve, worsen or new problems develop. They verbalized understanding.   IBeverely Pace Mayra Jolliffe, FNP, have reviewed all documentation for this visit. The documentation on 06/04/20 for the exam, diagnosis, procedures, and orders are all accurate and complete.    Jairo Ben, FNP  [facility] [phone] (phone) [phone] (fax)  [facility]

## 2020-06-02 DIAGNOSIS — Z1389 Encounter for screening for other disorder: Secondary | ICD-10-CM | POA: Insufficient documentation

## 2020-06-02 DIAGNOSIS — R21 Rash and other nonspecific skin eruption: Secondary | ICD-10-CM | POA: Insufficient documentation

## 2020-06-02 DIAGNOSIS — Z803 Family history of malignant neoplasm of breast: Secondary | ICD-10-CM | POA: Insufficient documentation

## 2020-06-02 DIAGNOSIS — E559 Vitamin D deficiency, unspecified: Secondary | ICD-10-CM | POA: Insufficient documentation

## 2020-06-02 DIAGNOSIS — N6310 Unspecified lump in the right breast, unspecified quadrant: Secondary | ICD-10-CM | POA: Insufficient documentation

## 2020-06-02 DIAGNOSIS — Z9889 Other specified postprocedural states: Secondary | ICD-10-CM | POA: Insufficient documentation

## 2020-06-03 ENCOUNTER — Telehealth: Payer: Self-pay | Admitting: Adult Health

## 2020-06-03 ENCOUNTER — Other Ambulatory Visit: Payer: Self-pay | Admitting: Adult Health

## 2020-06-03 DIAGNOSIS — N6310 Unspecified lump in the right breast, unspecified quadrant: Secondary | ICD-10-CM

## 2020-06-03 DIAGNOSIS — Z803 Family history of malignant neoplasm of breast: Secondary | ICD-10-CM

## 2020-06-03 NOTE — Telephone Encounter (Signed)
Per Delford Field they will need an order for [id number]. The order for mammogram that has been entered is for pt's without insurance,Thanks

## 2020-06-03 NOTE — Telephone Encounter (Signed)
Can you review this order ? It looks like you ordered ultrasounds of both breast and it looks like a diagnostic mammogram? I can reput in order I just want to make sure its diagnostic mammogram you want ordered. KW

## 2020-06-03 NOTE — Telephone Encounter (Signed)
Yes please add order for diagnostic mammogram- add location from of irregular breast exam to order. [place] orders should be in.

## 2020-06-04 LAB — CBC WITH DIFFERENTIAL/PLATELET
Eos: 2 %
Monocytes Absolute: 0.6 10*3/uL (ref 0.1–0.9)

## 2020-06-04 LAB — COMPREHENSIVE METABOLIC PANEL

## 2020-06-05 LAB — COMPREHENSIVE METABOLIC PANEL
ALT: 11 IU/L (ref 0–32)
AST: 15 IU/L (ref 0–40)
Albumin/Globulin Ratio: 1.3 (ref 1.2–2.2)
Albumin: 4.2 g/dL (ref 3.8–4.8)
Alkaline Phosphatase: 117 IU/L (ref 48–121)
BUN/Creatinine Ratio: 9 (ref 9–23)
Bilirubin Total: 0.2 mg/dL (ref 0.0–1.2)
CO2: 21 mmol/L (ref 20–29)
Calcium: 9.2 mg/dL (ref 8.7–10.2)
Chloride: 103 mmol/L (ref 96–106)
Creatinine, Ser: 0.82 mg/dL (ref 0.57–1.00)
GFR calc Af Amer: 109 mL/min/{1.73_m2} (ref >59)
GFR calc non Af Amer: 95 mL/min/{1.73_m2} (ref >59)
Globulin, Total: 3.2 g/dL (ref 1.5–4.5)
Glucose: 86 mg/dL (ref 65–99)
Potassium: 4.5 mmol/L (ref 3.5–5.2)
Sodium: 138 mmol/L (ref 134–144)

## 2020-06-05 LAB — CBC WITH DIFFERENTIAL/PLATELET
Basophils Absolute: 0.1 10*3/uL (ref 0.0–0.2)
Basos: 1 %
EOS (ABSOLUTE): 0.2 10*3/uL (ref 0.0–0.4)
Hematocrit: 38.5 % (ref 34.0–46.6)
Hemoglobin: 12.9 g/dL (ref 11.1–15.9)
Immature Grans (Abs): 0 10*3/uL (ref 0.0–0.1)
Immature Granulocytes: 0 %
Lymphocytes Absolute: 3.1 10*3/uL (ref 0.7–3.1)
Lymphs: 32 %
MCH: 28.7 pg (ref 26.6–33.0)
MCHC: 33.5 g/dL (ref 31.5–35.7)
MCV: 86 fL (ref 79–97)
Monocytes: 6 %
Neutrophils Absolute: 5.8 10*3/uL (ref 1.4–7.0)
Neutrophils: 59 %
Platelets: 373 10*3/uL (ref 150–450)
RBC: 4.5 x10E6/uL (ref 3.77–5.28)
RDW: 12.4 % (ref 11.7–15.4)
WBC: 9.7 10*3/uL (ref 3.4–10.8)

## 2020-06-05 LAB — VITAMIN D 25 HYDROXY (VIT D DEFICIENCY, FRACTURES): Vit D, 25-Hydroxy: 23.5 ng/mL — ABNORMAL LOW (ref 30.0–100.0)

## 2020-06-05 LAB — TSH: TSH: 2.89 u[IU]/mL (ref 0.450–4.500)

## 2020-06-06 NOTE — Progress Notes (Signed)
TSH for thyroid is within normal limits.  CBC is within normal limits no signs of anemia or infection.  P is within normal limits, normal glucose, electrolytes and kidney and liver function. Vitamin D level is deficient at 23.5, recommend prescription vitamin D 50,000 international units take 1 tablet by mouth every 7 days/once per week for 10 weeks.  Number 10 tablets no refills.  Recheck vitamin D level in 3 months.  She would like to take the vitamin D we can send in prescription as above.

## 2020-06-07 ENCOUNTER — Encounter: Payer: Self-pay | Admitting: Adult Health

## 2020-06-07 ENCOUNTER — Telehealth: Payer: Self-pay

## 2020-06-07 DIAGNOSIS — E559 Vitamin D deficiency, unspecified: Secondary | ICD-10-CM

## 2020-06-07 MED ORDER — VITAMIN D (ERGOCALCIFEROL) 1.25 MG (50000 UNIT) PO CAPS: 50000.0000 [IU] | ORAL_CAPSULE | ORAL | 0 refills | Status: DC

## 2020-06-07 NOTE — Telephone Encounter (Signed)
-----   Message from Berniece Pap, FNP sent at 06/06/2020 10:00 PM EDT ----- TSH for thyroid is within normal limits.  CBC is within normal limits no signs of anemia or infection.  P is within normal limits, normal glucose, electrolytes and kidney and liver function. Vitamin D level is deficient at 23.5, recommend prescription vitamin D 50,000 international units take 1 tablet by mouth every 7 days/once per week for 10 weeks.  Number 10 tablets no refills.  Recheck vitamin D level in 3 months.  She would like to take the vitamin D we can send in prescription as above.

## 2020-06-07 NOTE — Telephone Encounter (Signed)
Patient has viewed message on mychart, will send in prescription for Vitamin D to pharmacy listed on file. KW

## 2020-06-08 ENCOUNTER — Other Ambulatory Visit: Payer: Self-pay | Admitting: Adult Health

## 2020-06-08 DIAGNOSIS — E559 Vitamin D deficiency, unspecified: Secondary | ICD-10-CM

## 2020-06-11 ENCOUNTER — Other Ambulatory Visit: Payer: Self-pay

## 2020-06-11 ENCOUNTER — Ambulatory Visit
Admission: RE | Admit: 2020-06-11 | Discharge: 2020-06-11 | Disposition: A | Discharge status: Home / Self Care | Payer: Managed Care, Other (non HMO) | Source: Ambulatory Visit | Attending: Adult Health | Admitting: Adult Health

## 2020-06-11 ENCOUNTER — Ambulatory Visit: Admission: RE | Admit: 2020-06-11 | Payer: Managed Care, Other (non HMO) | Source: Ambulatory Visit

## 2020-06-11 DIAGNOSIS — Z803 Family history of malignant neoplasm of breast: Secondary | ICD-10-CM | POA: Diagnosis present

## 2020-06-11 DIAGNOSIS — N6310 Unspecified lump in the right breast, unspecified quadrant: Secondary | ICD-10-CM

## 2020-06-11 NOTE — Progress Notes (Signed)
Within normal limits breat ultrasound and mammogram. Screening mammogram recommended at age 34 unless other clinical concerns arise prior.

## 2020-06-14 ENCOUNTER — Telehealth: Payer: Self-pay

## 2020-06-14 NOTE — Telephone Encounter (Signed)
Written by Berniece Pap, FNP on 06/11/2020 5:08 PM EDT Seen by patient Maureen Reed on 06/11/2020 5:10 PM

## 2020-06-14 NOTE — Telephone Encounter (Signed)
-----   Message from Berniece Pap, FNP sent at 06/11/2020  5:08 PM EDT ----- Within normal limits breat ultrasound and mammogram. Screening mammogram recommended at age 34 unless other clinical concerns arise prior.

## 2020-07-30 NOTE — Progress Notes (Signed)
Established patient visit   Patient: Maureen Reed   DOB: May 16, 1986   34 y.o. Female  MRN: [id number] Visit Date: 08/02/2020  Today's healthcare provider: Jairo Ben, FNP   Chief Complaint  Patient presents with  . Rash   Subjective    HPI  Follow up for rash  The patient was last seen for this 2 months ago. Changes made at last visit include starting patient on Ketoconazole 2% cream and discontinuing nystatin-triamcinolone ointment  .  No itchiness, no dryness, no chanmge in size. No worsening.   She reports good compliance with treatment. She feels that condition is Unchanged. She is not having side effects.   Mammogram and Ultrasound were performed on 06/11/20 and were both within normal limits. Denies any breast pain, nipple discharge or soreness.  Patient  denies any fever, body aches,chills, rash, chest pain, shortness of breath, nausea, vomiting, or diarrhea.   -----------------------------------------------------------------------------------------   Patient Active Problem List   Diagnosis Date Noted  . Family history of breast cancer 06/02/2020  . Breast mass, right 06/02/2020  . Skin rash 06/02/2020  . Vitamin D  06/02/2020  . Routine health maintenance 06/02/2020  . H/O left wrist surgery- with hardware.  06/02/2020  . Closed fracture of left distal radius 03/07/2019  . BMI 40.0-44.9, adult (HCC) 10/01/2012   Past Medical History:  Diagnosis Date  . Allergy    Allergies  Allergen Reactions  . Other Other (See Comments)    Seasonal       Medications: Outpatient Medications Prior to Visit  Medication Sig  . fluticasone (FLONASE) 50 MCG/ACT nasal spray Place 2 sprays into both nostrils daily.  Marland Kitchen ketoconazole (NIZORAL) 2 % cream Apply 1 application topically daily.  . Vitamin D, Ergocalciferol, (DRISDOL) 1.25 MG (50000 UNIT) CAPS capsule Take 1 capsule (50,000 Units total) by mouth every 7 (seven) days.  . drospirenone-ethinyl  estradiol (YASMIN,ZARAH,SYEDA) 3-0.03 MG tablet Take by mouth.   No facility-administered medications prior to visit.    Review of Systems  Constitutional: Negative.   HENT: Negative.   Respiratory: Negative.   Gastrointestinal: Negative.   Genitourinary: Negative.   Musculoskeletal: Negative.   Skin: Positive for rash. Negative for color change, pallor and wound.  Neurological: Negative.   Hematological: Negative.   Psychiatric/Behavioral: Negative.     Last CBC Lab Results  Component Value Date   WBC 9.7 06/04/2020   HGB 12.9 06/04/2020   HCT 38.5 06/04/2020   MCV 86 06/04/2020   MCH 28.7 06/04/2020   RDW 12.4 06/04/2020   PLT 373 06/04/2020   Last metabolic panel Lab Results  Component Value Date   GLUCOSE 86 06/04/2020   NA 138 06/04/2020   K 4.5 06/04/2020   CL 103 06/04/2020   CO2 21 06/04/2020   BUN 7 06/04/2020   CREATININE 0.82 06/04/2020   GFRNONAA 95 06/04/2020   GFRAA 109 06/04/2020   CALCIUM 9.2 06/04/2020   PROT 7.4 06/04/2020   ALBUMIN 4.2 06/04/2020   LABGLOB 3.2 06/04/2020   AGRATIO 1.3 06/04/2020   BILITOT 0.2 06/04/2020   ALKPHOS 117 06/04/2020   AST 15 06/04/2020   ALT 11 06/04/2020   Last lipids Lab Results  Component Value Date   CHOL 150 03/16/2020   HDL 65 03/16/2020   LDLCALC 69 03/16/2020   TRIG 84 03/16/2020   CHOLHDL 2.3 03/16/2020   Last hemoglobin A1c No results found for: HGBA1C Last thyroid functions Lab Results  Component Value Date  TSH 2.890 06/04/2020   Last vitamin D Lab Results  Component Value Date   VD25OH 23.5 (L) 06/04/2020   Last vitamin B12 and Folate No results found for: VITAMINB12, FOLATE    Objective    Pulse 78   Temp 98.6 F (37 C) (Oral)   Resp 16   Wt 234 lb (106.1 kg)   SpO2 100%   BMI 42.12 kg/m  BP Readings from Last 3 Encounters:  06/01/20 130/80  03/16/20 122/82  09/20/18 128/82      Physical Exam Constitutional:      General: She is not in acute distress.     Appearance: She is not ill-appearing, toxic-appearing or diaphoretic.  HENT:     Head: Normocephalic and atraumatic.     Mouth/Throat:     Mouth: Mucous membranes are moist.  Eyes:     Conjunctiva/sclera: Conjunctivae normal.  Cardiovascular:     Rate and Rhythm: Normal rate and regular rhythm.     Pulses: Normal pulses.     Heart sounds: Normal heart sounds.  Abdominal:     Palpations: Abdomen is soft.  Musculoskeletal:     Cervical back: Normal range of motion and neck supple. No rigidity.  Lymphadenopathy:     Cervical: No cervical adenopathy.  Skin:    Capillary Refill: Capillary refill takes less than 2 seconds.     Findings: Erythema (right breast rash see media ) and rash present.  Neurological:     Mental Status: She is alert and oriented to person, place, and time.  Psychiatric:        Mood and Affect: Mood normal.        Thought Content: Thought content normal.        Judgment: Judgment normal.       Media Information   Document Information  Photos    08/02/2020 08:18  Attached To:  Office Visit on 08/02/20 with Eliora Nienhuis, Eula Fried, FNP  Source Information  Gizella [place], Eula Fried, FNP  [facility]    No results found for any visits on 08/02/20.  Assessment & Plan    1. Skin rash  Continue ketoconazole as prescribed. NO worsening of rash.Did not respond to topical steroids.  Mammogram within normal limits.  Will refer to dermatology.  - Ambulatory referral to Dermatology  2. Vitamin D Deficiency Finish Vitamin D prescription and recheck one week after completion of prescription and - walk into lab instructions given. Will call with results.    Return if symptoms worsen or fail to improve, for at any time for any worsening symptoms.     Advised patient call the office or your primary care doctor for an appointment if no improvement within 72 hours or if any symptoms change or worsen at any time  Advised ER or urgent Care if after hours or on  weekend. Call 911 for emergency symptoms at any time.Patinet verbalized understanding of all instructions given/reviewed and treatment plan and has no further questions or concerns at this time.     IBeverely Pace Zaya Kessenich, FNP, have reviewed all documentation for this visit. The documentation on 08/02/20 for the exam, diagnosis, procedures, and orders are all accurate and complete.    Jairo Ben, FNP  [facility] [phone] (phone) [phone] (fax)  [facility]

## 2020-08-02 ENCOUNTER — Encounter: Payer: Self-pay | Admitting: Adult Health

## 2020-08-02 ENCOUNTER — Other Ambulatory Visit: Payer: Self-pay

## 2020-08-02 ENCOUNTER — Ambulatory Visit (INDEPENDENT_AMBULATORY_CARE_PROVIDER_SITE_OTHER): Payer: Managed Care, Other (non HMO) | Admitting: Adult Health

## 2020-08-02 VITALS — BP 123/78 | HR 78 | Temp 98.6°F | Resp 16 | Wt 234.0 lb

## 2020-08-02 DIAGNOSIS — E559 Vitamin D deficiency, unspecified: Secondary | ICD-10-CM | POA: Diagnosis not present

## 2020-08-02 DIAGNOSIS — R21 Rash and other nonspecific skin eruption: Secondary | ICD-10-CM

## 2020-08-02 NOTE — Patient Instructions (Signed)
Rash, Adult  A rash is a change in the color of your skin. A rash can also change the way your skin feels. There are many different conditions and factors that can cause a rash. Follow these instructions at home: The goal of treatment is to stop the itching and keep the rash from spreading. Watch for any changes in your symptoms. Let your doctor know about them. Follow these instructions to help with your condition: Medicine Take or apply over-the-counter and prescription medicines only as told by your doctor. These may include medicines:  To treat red or swollen skin (corticosteroid creams).  To treat itching.  To treat an allergy (oral antihistamines).  To treat very bad symptoms (oral corticosteroids).  Skin care  Put cool cloths (compresses) on the affected areas.  Do not scratch or rub your skin.  Avoid covering the rash. Make sure that the rash is exposed to air as much as possible. Managing itching and discomfort  Avoid hot showers or baths. These can make itching worse. A cold shower may help.  Try taking a bath with: ? Epsom salts. You can get these at your local pharmacy or grocery store. Follow the instructions on the package. ? Baking soda. Pour a small amount into the bath as told by your doctor. ? Colloidal oatmeal. You can get this at your local pharmacy or grocery store. Follow the instructions on the package.  Try putting baking soda paste onto your skin. Stir water into baking soda until it gets like a paste.  Try putting on a lotion that relieves itchiness (calamine lotion).  Keep cool and out of the sun. Sweating and being hot can make itching worse. General instructions   Rest as needed.  Drink enough fluid to keep your pee (urine) pale yellow.  Wear loose-fitting clothing.  Avoid scented soaps, detergents, and perfumes. Use gentle soaps, detergents, perfumes, and other cosmetic products.  Avoid anything that causes your rash. Keep a journal to  help track what causes your rash. Write down: ? What you eat. ? What cosmetic products you use. ? What you drink. ? What you wear. This includes jewelry.  Keep all follow-up visits as told by your doctor. This is important. Contact a doctor if:  You sweat at night.  You lose weight.  You pee (urinate) more than normal.  You pee less than normal, or you notice that your pee is a darker color than normal.  You feel weak.  You throw up (vomit).  Your skin or the whites of your eyes look yellow (jaundice).  Your skin: ? Tingles. ? Is numb.  Your rash: ? Does not go away after a few days. ? Gets worse.  You are: ? More thirsty than normal. ? More tired than normal.  You have: ? New symptoms. ? Pain in your belly (abdomen). ? A fever. ? Watery poop (diarrhea). Get help right away if:  You have a fever and your symptoms suddenly get worse.  You start to feel mixed up (confused).  You have a very bad headache or a stiff neck.  You have very bad joint pains or stiffness.  You have jerky movements that you cannot control (seizure).  Your rash covers all or most of your body. The rash may or may not be painful.  You have blisters that: ? Are on top of the rash. ? Grow larger. ? Grow together. ? Are painful. ? Are inside your nose or mouth.  You have a rash   that: ? Looks like purple pinprick-sized spots all over your body. ? Has a "bull's eye" or looks like a target. ? Is red and painful, causes your skin to peel, and is not from being in the sun too long. Summary  A rash is a change in the color of your skin. A rash can also change the way your skin feels.  The goal of treatment is to stop the itching and keep the rash from spreading.  Take or apply over-the-counter and prescription medicines only as told by your doctor.  Contact a doctor if you have new symptoms or symptoms that get worse.  Keep all follow-up visits as told by your doctor. This is  important. This information is not intended to replace advice given to you by your health care provider. Make sure you discuss any questions you have with your health care provider. Document Revised: 02/21/2019 Document Reviewed: 06/03/2018 Elsevier Patient Education  2020 Elsevier Inc.  

## 2020-08-20 ENCOUNTER — Other Ambulatory Visit: Payer: Self-pay | Admitting: Adult Health

## 2020-08-20 DIAGNOSIS — E559 Vitamin D deficiency, unspecified: Secondary | ICD-10-CM

## 2020-08-20 LAB — VITAMIN D 25 HYDROXY (VIT D DEFICIENCY, FRACTURES): Vit D, 25-Hydroxy: 35.8 ng/mL (ref 30.0–100.0)

## 2020-08-20 MED ORDER — VITAMIN D (ERGOCALCIFEROL) 1.25 MG (50000 UNIT) PO CAPS: 50000.0000 [IU] | ORAL_CAPSULE | ORAL | 0 refills | Status: DC

## 2020-08-20 NOTE — Progress Notes (Signed)
Meds ordered this encounter  Medications  . Vitamin D, Ergocalciferol, (DRISDOL) 1.25 MG (50000 UNIT) CAPS capsule    Sig: Take 1 capsule (50,000 Units total) by mouth every 7 (seven) days.    Dispense:  12 capsule    Refill:  0  continue prescription as above- recheck  Lab when completed.

## 2020-08-20 NOTE — Progress Notes (Signed)
Meds ordered this encounter Medications Vitamin D, Ergocalciferol, (DRISDOL) 1.25 MG (50000 UNIT) CAPS capsule Sig: Take 1 capsule (50,000 Units total) by mouth every 7 (seven) days.Dispense:  12 capsule Refill:  0  Vitamin D in range now, will continue prescription above for 12 more weeks as directed and then recheck lab after finished with 12 weeks.

## 2020-08-26 ENCOUNTER — Telehealth: Payer: Self-pay | Admitting: Adult Health

## 2020-08-26 NOTE — Telephone Encounter (Signed)
[facility] faxed refill request for the following medications: ° °Vitamin D, Ergocalciferol, (DRISDOL) 1.25 MG (50000 UNIT) CAPS capsule  ° ° °Please advise. ° °Thanks, °TGH ° °

## 2020-08-26 NOTE — Telephone Encounter (Signed)
Prescription filled 08/20/20. KW

## 2020-11-03 ENCOUNTER — Other Ambulatory Visit: Payer: Self-pay | Admitting: Adult Health

## 2020-11-03 DIAGNOSIS — E559 Vitamin D deficiency, unspecified: Secondary | ICD-10-CM

## 2020-11-03 NOTE — Telephone Encounter (Signed)
Requested medication (s) are due for refill today: yes  Requested medication (s) are on the active medication list:yes  Last refill: 08/20/20  #12  0 refills  Future visit scheduled yes 06/02/21  Notes to clinic:not delegated  Requested Prescriptions  Pending Prescriptions Disp Refills   Vitamin D, Ergocalciferol, (DRISDOL) 1.25 MG (50000 UNIT) CAPS capsule [Pharmacy Med Name: VITAMIN D2 1.25MG (50,000 UNIT)] 12 capsule 0    Sig: Take 1 capsule (50,000 Units total) by mouth every 7 (seven) days.      Endocrinology:  Vitamins - Vitamin D Supplementation Failed - 11/03/2020  1:06 AM      Failed - 50,000 IU strengths are not delegated      Failed - Phosphate in normal range and within 360 days    No results found for: PHOS        Passed - Ca in normal range and within 360 days    Calcium  Date Value Ref Range Status  06/04/2020 9.2 8.7 - 10.2 mg/dL Final          Passed - Vitamin D in normal range and within 360 days    Vit D, 25-Hydroxy  Date Value Ref Range Status  08/19/2020 35.8 30.0 - 100.0 ng/mL Final    Comment:    Vitamin D deficiency has been defined by the Institute of Medicine and an Endocrine Society practice guideline as a level of serum 25-OH vitamin D less than 20 ng/mL (1,2). The Endocrine Society went on to further define vitamin D insufficiency as a level between 21 and 29 ng/mL (2). 1. IOM (Institute of Medicine). 2010. Dietary reference    intakes for calcium and D. [place]: The    [employer]. 2. Holick MF, Binkley Montegut, Bischoff-Ferrari HA, et al.    Evaluation, treatment, and prevention of vitamin D    deficiency: an Endocrine Society clinical practice    guideline. JCEM. 2011 Jul; 96(7):1911-30.           Passed - Valid encounter within last 12 months    Recent Outpatient Visits           3 months ago Skin rash   [facility] Flinchum, Eula Fried, FNP   5 months ago Routine health maintenance   [facility] Flinchum, Eula Fried, FNP       Future Appointments             In 7 months Flinchum, Eula Fried, FNP [facility]

## 2020-11-11 ENCOUNTER — Telehealth: Payer: Self-pay | Admitting: Adult Health

## 2020-11-11 DIAGNOSIS — E559 Vitamin D deficiency, unspecified: Secondary | ICD-10-CM

## 2020-11-11 NOTE — Telephone Encounter (Signed)
Order has been faxed. KW 

## 2020-11-11 NOTE — Telephone Encounter (Signed)
Copied from CRM [id number]. Topic: General - Other >> Nov 11, 2020  4:33 PM Gaetana Michaelis A wrote: Reason for CRM: Patient is requesting lab orders be forwarded via fax to [facility] at  [address], [place], [place] [address] Fax number is [phone]

## 2020-11-12 LAB — VITAMIN D 25 HYDROXY (VIT D DEFICIENCY, FRACTURES): Vit D, 25-Hydroxy: 40.1 ng/mL (ref 30.0–100.0)

## 2020-11-16 ENCOUNTER — Other Ambulatory Visit: Payer: Self-pay | Admitting: Adult Health

## 2020-11-16 DIAGNOSIS — E559 Vitamin D deficiency, unspecified: Secondary | ICD-10-CM

## 2020-11-16 MED ORDER — VITAMIN D (ERGOCALCIFEROL) 1.25 MG (50000 UNIT) PO CAPS: 50000.0000 [IU] | ORAL_CAPSULE | ORAL | 0 refills | Status: DC

## 2020-11-16 NOTE — Telephone Encounter (Signed)
Sent in 8 more weeks of Vitamin D 50,000 international units once weekly for 8 weeks then recheck lab walk in advised.

## 2020-11-16 NOTE — Progress Notes (Signed)
Meds ordered this encounter  Medications  . DISCONTD: Vitamin D, Ergocalciferol, (DRISDOL) 1.25 MG (50000 UNIT) CAPS capsule    Sig: Take 1 capsule (50,000 Units total) by mouth every 7 (seven) days.    Dispense:  12 capsule    Refill:  0  . Vitamin D, Ergocalciferol, (DRISDOL) 1.25 MG (50000 UNIT) CAPS capsule    Sig: Take 1 capsule (50,000 Units total) by mouth every 7 (seven) days. (taking one tablet per week)    Dispense:  8 capsule    Refill:  0    Orders Placed This Encounter  Procedures  . VITAMIN D 25 Hydroxy (Vit-D Deficiency, Fractures)

## 2021-01-31 ENCOUNTER — Other Ambulatory Visit: Payer: Self-pay | Admitting: Adult Health

## 2021-01-31 DIAGNOSIS — E559 Vitamin D deficiency, unspecified: Secondary | ICD-10-CM

## 2021-01-31 NOTE — Telephone Encounter (Signed)
I called pt and pt notified me that her Rx was already refilled by pharmacy. I informed pt that when she begins to run low, to give [facility] a call to schedule Rx refill OV.

## 2021-01-31 NOTE — Telephone Encounter (Signed)
Requested medication (s) are due for refill today: yes  Requested medication (s) are on the active medication list: yes  Last refill:  11/16/2020  Future visit scheduled: yes  Notes to clinic:  this refill cannot be delegated    Requested Prescriptions  Pending Prescriptions Disp Refills   Vitamin D, Ergocalciferol, (DRISDOL) 1.25 MG (50000 UNIT) CAPS capsule [Pharmacy Med Name: VITAMIN D2 1.25MG (50,000 UNIT)] 12 capsule     Sig: TAKE 1 CAPSULE (50,000 UNITS TOTAL) BY MOUTH EVERY 7 (SEVEN) DAYS      Endocrinology:  Vitamins - Vitamin D Supplementation Failed - 01/31/2021  1:22 AM      Failed - 50,000 IU strengths are not delegated      Failed - Phosphate in normal range and within 360 days    No results found for: PHOS        Passed - Ca in normal range and within 360 days    Calcium  Date Value Ref Range Status  06/04/2020 9.2 8.7 - 10.2 mg/dL Final          Passed - Vitamin D in normal range and within 360 days    Vit D, 25-Hydroxy  Date Value Ref Range Status  11/11/2020 40.1 30.0 - 100.0 ng/mL Final    Comment:    Vitamin D deficiency has been defined by the Institute of Medicine and an Endocrine Society practice guideline as a level of serum 25-OH vitamin D less than 20 ng/mL (1,2). The Endocrine Society went on to further define vitamin D insufficiency as a level between 21 and 29 ng/mL (2). 1. IOM (Institute of Medicine). 2010. Dietary reference    intakes for calcium and D. [place]: The    [employer]. 2. Holick MF, Binkley Piqua, Bischoff-Ferrari HA, et al.    Evaluation, treatment, and prevention of vitamin D    deficiency: an Endocrine Society clinical practice    guideline. JCEM. 2011 Jul; 96(7):1911-30.           Passed - Valid encounter within last 12 months    Recent Outpatient Visits           6 months ago Skin rash   [facility] Flinchum, Eula Fried, FNP   8 months ago Routine health maintenance   [facility] Flinchum, Eula Fried, FNP       Future Appointments             In 4 months Flinchum, Eula Fried, FNP [facility]

## 2021-03-24 ENCOUNTER — Encounter: Payer: Managed Care, Other (non HMO) | Admitting: Adult Health

## 2021-04-13 ENCOUNTER — Encounter: Payer: Managed Care, Other (non HMO) | Admitting: Adult Health

## 2021-04-14 ENCOUNTER — Ambulatory Visit (INDEPENDENT_AMBULATORY_CARE_PROVIDER_SITE_OTHER): Payer: Managed Care, Other (non HMO) | Admitting: Adult Health

## 2021-04-14 ENCOUNTER — Encounter: Payer: Self-pay | Admitting: Adult Health

## 2021-04-14 ENCOUNTER — Other Ambulatory Visit: Payer: Self-pay

## 2021-04-14 VITALS — BP 132/80 | HR 91 | Temp 98.3°F | Ht 63.39 in | Wt 238.8 lb

## 2021-04-14 DIAGNOSIS — Z131 Encounter for screening for diabetes mellitus: Secondary | ICD-10-CM

## 2021-04-14 DIAGNOSIS — Z6841 Body Mass Index (BMI) 40.0 and over, adult: Secondary | ICD-10-CM | POA: Diagnosis not present

## 2021-04-14 DIAGNOSIS — Z0000 Encounter for general adult medical examination without abnormal findings: Secondary | ICD-10-CM | POA: Diagnosis not present

## 2021-04-14 DIAGNOSIS — Z13220 Encounter for screening for lipoid disorders: Secondary | ICD-10-CM | POA: Diagnosis not present

## 2021-04-14 DIAGNOSIS — E559 Vitamin D deficiency, unspecified: Secondary | ICD-10-CM | POA: Diagnosis not present

## 2021-04-14 DIAGNOSIS — Z803 Family history of malignant neoplasm of breast: Secondary | ICD-10-CM

## 2021-04-14 DIAGNOSIS — Z1389 Encounter for screening for other disorder: Secondary | ICD-10-CM | POA: Diagnosis not present

## 2021-04-14 LAB — URINALYSIS, MICROSCOPIC ONLY: RBC / HPF: NONE SEEN (ref 0–?)

## 2021-04-14 LAB — CBC WITH DIFFERENTIAL/PLATELET
Basophils Absolute: 0 10*3/uL (ref 0.0–0.1)
Basophils Relative: 0.4 % (ref 0.0–3.0)
Eosinophils Absolute: 0.8 10*3/uL — ABNORMAL HIGH (ref 0.0–0.7)
Eosinophils Relative: 7.5 % — ABNORMAL HIGH (ref 0.0–5.0)
HCT: 39.1 % (ref 36.0–46.0)
Hemoglobin: 12.9 g/dL (ref 12.0–15.0)
Lymphocytes Relative: 27.6 % (ref 12.0–46.0)
Lymphs Abs: 3.1 10*3/uL (ref 0.7–4.0)
MCHC: 33.1 g/dL (ref 30.0–36.0)
MCV: 86.4 fl (ref 78.0–100.0)
Monocytes Absolute: 0.6 10*3/uL (ref 0.1–1.0)
Monocytes Relative: 5.2 % (ref 3.0–12.0)
Neutro Abs: 6.7 10*3/uL (ref 1.4–7.7)
Neutrophils Relative %: 59.3 % (ref 43.0–77.0)
Platelets: 307 10*3/uL (ref 150.0–400.0)
RBC: 4.52 Mil/uL (ref 3.87–5.11)
RDW: 13.6 % (ref 11.5–15.5)
WBC: 11.2 10*3/uL — ABNORMAL HIGH (ref 4.0–10.5)

## 2021-04-14 LAB — TSH: TSH: 3.65 u[IU]/mL (ref 0.35–4.50)

## 2021-04-14 LAB — VITAMIN D 25 HYDROXY (VIT D DEFICIENCY, FRACTURES): VITD: 40.83 ng/mL (ref 30.00–100.00)

## 2021-04-14 NOTE — Progress Notes (Signed)
Established Patient Office Visit  Subjective:  [name], female    DOB: 03/26/1986  Age: 35 y.o. MRN: [id number]  CC:  Chief Complaint  Patient presents with  . Annual Exam    HPI Maureen Reed presents for annual exam.  She just completed her Vitamin D prescription.  She feels well today.    12/06/2021 PAP is due. She had last at [facility]. 12/06/2018. She will schedule here when due. Denies any abnormal complaints. Currently still on Yasmin no complications or unwanted side effects. Does not need refills at this time.   Has no other concerns to address.   Patient's last menstrual period was 04/05/2021.  Patient  denies any fever, body aches,chills, rash, chest pain, shortness of breath, nausea, vomiting, or diarrhea.  Denies dizziness, lightheadedness, pre syncopal or syncopal episodes.   Up to date eye exams, and on vision.  Dentist was last week.  Wears glasses.   Family history of breast cancer, and testing was done as below and was found to be negative, she has not noticed any new areas of concern and no persistent symptoms from previous visit. No nipple discharge or changes to skin/ breast noted by patient.  IMPRESSION: No evidence of malignancy in either breast. The patient does not palpate a discrete lump in either breast today.  RECOMMENDATION: Screening mammogram at age 82 unless there are persistent or intervening clinical concerns. (Code:SM-B-40A)  I have discussed the findings and recommendations with the patient. If applicable, a reminder letter will be sent to the patient regarding the next appointment.  BI-RADS CATEGORY  1: Negative.   Electronically Signed   By: Britta Mccreedy M.D.   On: 06/11/2020 14:47 Mammogram:  RECOMMENDATION: Screening mammogram at age 12 unless there are persistent or intervening clinical concerns. (Code:SM-B-40A)  I have discussed the findings and recommendations with the patient. If applicable, a  reminder letter will be sent to the patient regarding the next appointment.  BI-RADS CATEGORY  1: Negative.   Electronically Signed   By: Britta Mccreedy M.D.   On: 06/11/2020 14:47   Past Medical History:  Diagnosis Date  . Allergy     Past Surgical History:  Procedure Laterality Date  . WRIST SURGERY Left    2020   Immunization History  Administered Date(s) Administered  . Influenza-Unspecified 10/30/2020  . PFIZER(Purple Top)SARS-COV-2 Vaccination 10/30/2020  . Tdap 11/13/2008, 05/30/2019   .hea  Family History  Problem Relation Age of Onset  . Hypertension Mother   . Breast cancer Mother 85  . Skin cancer Mother   . Diabetes Mellitus II Mother   . Atrial fibrillation Mother   . Asthma Father   . Hypertension Maternal Aunt   . Breast cancer Maternal Aunt 64  . Ulcerative colitis Paternal Aunt   . Diabetes Mellitus II Paternal Aunt   . Cataracts Paternal Aunt   . Anemia Paternal Aunt   . Parkinson's disease Paternal Aunt   . Hypertension Paternal Aunt   . Hyperlipidemia Paternal Aunt   . Fibroids Paternal Aunt   . Other Paternal Aunt   . Heart disease Maternal Grandmother   . Stroke Maternal Grandmother   . Esophageal cancer Maternal Grandfather   . Liver cancer Maternal Grandfather   . Hyperlipidemia Maternal Grandfather   . Hypertension Maternal Grandfather   . Parkinson's disease Paternal Grandmother   . Anemia Paternal Grandmother   . Hypotension Paternal Grandmother   . Cataracts Paternal Grandmother   . Breast cancer Paternal Grandfather   .  Diabetes Mellitus II Paternal Grandfather   . Heart disease Paternal Grandfather   . Liver cancer Paternal Grandfather     Social History   Socioeconomic History  . Marital status: Single    Spouse name: Not on file  . Number of children: Not on file  . Years of education: Not on file  . Highest education level: Not on file  Occupational History  . Not on file  Tobacco Use  . Smoking status:  Never Smoker  . Smokeless tobacco: Never Used  Substance and Sexual Activity  . Alcohol use: Yes    Alcohol/week: 1.0 standard drink    Types: 1 Glasses of wine per week  . Drug use: Never  . Sexual activity: Not Currently  Other Topics Concern  . Not on file  Social History Narrative  . Not on file   Social Determinants of Health   Financial Resource Strain: Not on file  Food Insecurity: Not on file  Transportation Needs: Not on file  Physical Activity: Not on file  Stress: Not on file  Social Connections: Not on file  Intimate Partner Violence: Not on file    Outpatient Medications Prior to Visit  Medication Sig Dispense Refill  . drospirenone-ethinyl estradiol (YASMIN,ZARAH,SYEDA) 3-0.03 MG tablet Take by mouth.    . fluticasone (FLONASE) 50 MCG/ACT nasal spray Place 2 sprays into both nostrils daily. 16 g 0  . Vitamin D, Ergocalciferol, (DRISDOL) 1.25 MG (50000 UNIT) CAPS capsule Take 1 capsule (50,000 Units total) by mouth every 7 (seven) days. (taking one tablet per week) 8 capsule 0  . ketoconazole (NIZORAL) 2 % cream Apply 1 application topically daily. 15 g 0   No facility-administered medications prior to visit.    Allergies  Allergen Reactions  . Other Other (See Comments)    Seasonal    ROS Review of Systems  Constitutional: Negative.   HENT: Negative.   Respiratory: Negative.   Cardiovascular: Negative.   Genitourinary: Negative.   Musculoskeletal: Negative.   Neurological: Negative.   Hematological: Negative.   Psychiatric/Behavioral: Negative.       Objective:    Physical Exam Vitals reviewed.  Constitutional:      General: She is not in acute distress.    Appearance: She is well-developed. She is obese. She is not ill-appearing, toxic-appearing or diaphoretic.     Interventions: She is not intubated. HENT:     Head: Normocephalic and atraumatic.     Right Ear: Tympanic membrane, ear canal and external ear normal. There is no impacted  cerumen.     Left Ear: Tympanic membrane, ear canal and external ear normal. There is no impacted cerumen.     Nose: Nose normal. No congestion or rhinorrhea.     Mouth/Throat:     Mouth: Mucous membranes are moist.     Pharynx: Oropharynx is clear. No oropharyngeal exudate or posterior oropharyngeal erythema.  Eyes:     General: Lids are normal. No scleral icterus.       Right eye: No discharge.        Left eye: No discharge.     Extraocular Movements: Extraocular movements intact.     Conjunctiva/sclera: Conjunctivae normal.     Right eye: Right conjunctiva is not injected. No exudate or hemorrhage.    Left eye: Left conjunctiva is not injected. No exudate or hemorrhage.    Pupils: Pupils are equal, round, and reactive to light.  Neck:     Thyroid: No thyroid mass or thyromegaly.  Vascular: Normal carotid pulses. No carotid bruit, hepatojugular reflux or JVD.     Trachea: Trachea and phonation normal. No tracheal tenderness or tracheal deviation.     Meningeal: Brudzinski's sign and Kernig's sign absent.  Cardiovascular:     Rate and Rhythm: Normal rate and regular rhythm.     Pulses: Normal pulses.          Radial pulses are 2+ on the right side and 2+ on the left side.       Dorsalis pedis pulses are 2+ on the right side and 2+ on the left side.       Posterior tibial pulses are 2+ on the right side and 2+ on the left side.     Heart sounds: Normal heart sounds, S1 normal and S2 normal. Heart sounds not distant. No murmur heard. No friction rub. No gallop.   Pulmonary:     Effort: Pulmonary effort is normal. No tachypnea, bradypnea, accessory muscle usage or respiratory distress. She is not intubated.     Breath sounds: Normal breath sounds. No stridor. No wheezing, rhonchi or rales.  Chest:     Chest wall: No tenderness.  Breasts:     Right: No supraclavicular adenopathy.     Left: No supraclavicular adenopathy.      Comments: deferred by patient.  Abdominal:      General: Bowel sounds are normal. There is no distension or abdominal bruit.     Palpations: Abdomen is soft. There is no shifting dullness, fluid wave, hepatomegaly, splenomegaly, mass or pulsatile mass.     Tenderness: There is no abdominal tenderness. There is no right CVA tenderness, left CVA tenderness, guarding or rebound.     Hernia: No hernia is present.  Musculoskeletal:        General: No swelling, tenderness, deformity or signs of injury. Normal range of motion.     Cervical back: Full passive range of motion without pain, normal range of motion and neck supple. No edema, erythema or rigidity. No spinous process tenderness or muscular tenderness. Normal range of motion.     Right lower leg: No edema.     Left lower leg: No edema.  Lymphadenopathy:     Head:     Right side of head: No submental, submandibular, tonsillar, preauricular, posterior auricular or occipital adenopathy.     Left side of head: No submental, submandibular, tonsillar, preauricular, posterior auricular or occipital adenopathy.     Cervical: No cervical adenopathy.     Right cervical: No superficial, deep or posterior cervical adenopathy.    Left cervical: No superficial, deep or posterior cervical adenopathy.     Upper Body:     Right upper body: No supraclavicular or pectoral adenopathy.     Left upper body: No supraclavicular or pectoral adenopathy.  Skin:    General: Skin is warm and dry.     Coloration: Skin is not jaundiced or pale.     Findings: No abrasion, bruising, burn, ecchymosis, erythema, lesion, petechiae or rash.     Nails: There is no clubbing.  Neurological:     Mental Status: She is alert and oriented to person, place, and time. Mental status is at baseline.     GCS: GCS eye subscore is 4. GCS verbal subscore is 5. GCS motor subscore is 6.     Cranial Nerves: No cranial nerve deficit.     Sensory: No sensory deficit.     Motor: No weakness, tremor, atrophy, abnormal muscle tone or  seizure  activity.     Coordination: Coordination normal.     Gait: Gait normal.     Deep Tendon Reflexes: Reflexes are normal and symmetric. Reflexes normal.     Reflex Scores:      Tricep reflexes are 2+ on the right side and 2+ on the left side.      Bicep reflexes are 2+ on the right side and 2+ on the left side.      Brachioradialis reflexes are 2+ on the right side and 2+ on the left side.      Patellar reflexes are 2+ on the right side and 2+ on the left side.      Achilles reflexes are 2+ on the right side and 2+ on the left side. Psychiatric:        Mood and Affect: Mood normal.        Speech: Speech normal.        Behavior: Behavior normal.        Thought Content: Thought content normal.        Judgment: Judgment normal.     BP 132/80   Pulse 91   Temp 98.3 F (36.8 C)   Ht 5' 3.39" (1.61 m)   Wt 238 lb 12.8 oz (108.3 kg)   LMP 04/05/2021   SpO2 97%   BMI 41.79 kg/m  Wt Readings from Last 3 Encounters:  04/14/21 238 lb 12.8 oz (108.3 kg)  08/02/20 234 lb (106.1 kg)  06/01/20 233 lb 9.6 oz (106 kg)     There are no preventive care reminders to display for this patient.  There are no preventive care reminders to display for this patient.  Lab Results  Component Value Date   TSH 2.890 06/04/2020   Lab Results  Component Value Date   WBC 9.7 06/04/2020   HGB 12.9 06/04/2020   HCT 38.5 06/04/2020   MCV 86 06/04/2020   PLT 373 06/04/2020   Lab Results  Component Value Date   NA 138 06/04/2020   K 4.5 06/04/2020   CO2 21 06/04/2020   GLUCOSE 86 06/04/2020   BUN 7 06/04/2020   CREATININE 0.82 06/04/2020   BILITOT 0.2 06/04/2020   ALKPHOS 117 06/04/2020   AST 15 06/04/2020   ALT 11 06/04/2020   PROT 7.4 06/04/2020   ALBUMIN 4.2 06/04/2020   CALCIUM 9.2 06/04/2020   Lab Results  Component Value Date   CHOL 150 03/16/2020   Lab Results  Component Value Date   HDL 65 03/16/2020   Lab Results  Component Value Date   LDLCALC 69 03/16/2020   Lab  Results  Component Value Date   TRIG 84 03/16/2020   Lab Results  Component Value Date   CHOLHDL 2.3 03/16/2020   No results found for: HGBA1C    Assessment & Plan:   Problem List Items Addressed This Visit      Other   BMI 40.0-44.9, adult (HCC)   Relevant Orders   Comprehensive metabolic panel   CBC with Differential/Platelet   TSH   Family history of breast cancer   Vitamin D    Relevant Orders   VITAMIN D 25 Hydroxy (Vit-D Deficiency, Fractures)   Screening for blood or protein in urine - Primary   Relevant Orders   Urine Microscopic Only     Orders Placed This Encounter  Procedures  . Urine Microscopic Only  . Hgb A1c w/o eAG  . VITAMIN D 25 Hydroxy (Vit-D Deficiency, Fractures)  .  Lipid panel  . Comprehensive metabolic panel  . CBC with Differential/Platelet  . TSH  labs fasting today.  The patient has been in otherwise good general health in the past. The patient is advised to begin progressive daily aerobic exercise program, follow a low fat, low cholesterol diet, attempt to lose weight, reduce salt in diet and cooking, reduce exposure to stress, improve dietary compliance, use calcium 1 gram daily with Vit D, continue current medications and continue current healthy lifestyle patterns. Self breast exams advised and report any changes to provider.  No orders of the defined types were placed in this encounter.   Follow-up: Return in about 1 year (around 04/14/2022), or if symptoms worsen or fail to improve, for at any time for any worsening symptoms, Go to Emergency room/ urgent care if worse.   yearly and PRN for as needed visits or new concerns.   Red Flags discussed. The patient was given clear instructions to go to ER or return to medical center if any red flags develop, symptoms do not improve, worsen or new problems develop. They verbalized understanding.   Jairo Ben, FNP

## 2021-04-14 NOTE — Patient Instructions (Signed)
Vitamin D Deficiency Vitamin D deficiency is when your body does not have enough vitamin D. Vitamin D is important to your body because:  It helps your body use other minerals.  It helps to keep your bones strong and healthy.  It may help to prevent some diseases.  It helps your heart and other muscles work well. Not getting enough vitamin D can make your bones soft. It can also cause other health problems. What are the causes? This condition may be caused by:  Not eating enough foods that contain vitamin D.  Not getting enough sun.  Having diseases that make it hard for your body to absorb vitamin D.  Having a surgery in which a part of the stomach or a part of the small intestine is removed.  Having kidney disease or liver disease. What increases the risk? You are more likely to get this condition if:  You are older.  You do not spend much time outdoors.  You live in a nursing home.  You have had broken bones.  You have weak or thin bones (osteoporosis).  You have a disease or condition that changes how your body absorbs vitamin D.  You have dark skin.  You take certain medicines.  You are overweight or obese. What are the signs or symptoms?  In mild cases, there may not be any symptoms. If the condition is very bad, symptoms may include: ? Bone pain. ? Muscle pain. ? Falling often. ? Broken bones caused by a minor injury. How is this treated? Treatment may include taking supplements as told by your doctor. Your doctor will tell you what dose is best for you. Supplements may include:  Vitamin D.  Calcium. Follow these instructions at home: Eating and drinking  Eat foods that contain vitamin D, such as: ? Dairy products, cereals, or juices with added vitamin D. Check the label. ? Fish, such as salmon or trout. ? Eggs. ? Oysters. ? Mushrooms. The items listed above may not be a complete list of what you can eat and drink. Contact a dietitian for more  options.   General instructions  Take medicines and supplements only as told by your doctor.  Get regular, safe exposure to natural sunlight.  Do not use a tanning bed.  Maintain a healthy weight. Lose weight if needed.  Keep all follow-up visits as told by your doctor. This is important. How is this prevented?  You can get vitamin D by: ? Eating foods that naturally contain vitamin D. ? Eating or drinking products that have vitamin D added to them, such as cereals, juices, and milk. ? Taking vitamin D or a multivitamin that contains vitamin D. ? Being in the sun. Your body makes vitamin D when your skin is exposed to sunlight. Your body changes the sunlight into a form of the vitamin that it can use. Contact a doctor if:  Your symptoms do not go away.  You feel sick to your stomach (nauseous).  You throw up (vomit).  You poop less often than normal, or you have trouble pooping (constipation). Summary  Vitamin D deficiency is when your body does not have enough vitamin D.  Vitamin D helps to keep your bones strong and healthy.  This condition is often treated by taking a supplement.  Your doctor will tell you what dose is best for you. This information is not intended to replace advice given to you by your health care provider. Make sure you discuss any questions   you have with your health care provider. Document Revised: 07/08/2018 Document Reviewed: 07/08/2018 Elsevier Patient Education  2021 Elsevier Inc. Health Maintenance, Female Adopting a healthy lifestyle and getting preventive care are important in promoting health and wellness. Ask your health care provider about:  The right schedule for you to have regular tests and exams.  Things you can do on your own to prevent diseases and keep yourself healthy. What should I know about diet, weight, and exercise? Eat a healthy diet  Eat a diet that includes plenty of vegetables, fruits, low-fat dairy products, and lean  protein.  Do not eat a lot of foods that are high in solid fats, added sugars, or sodium.   Maintain a healthy weight Body mass index (BMI) is used to identify weight problems. It estimates body fat based on height and weight. Your health care provider can help determine your BMI and help you achieve or maintain a healthy weight. Get regular exercise Get regular exercise. This is one of the most important things you can do for your health. Most adults should:  Exercise for at least 150 minutes each week. The exercise should increase your heart rate and make you sweat (moderate-intensity exercise).  Do strengthening exercises at least twice a week. This is in addition to the moderate-intensity exercise.  Spend less time sitting. Even light physical activity can be beneficial. Watch cholesterol and blood lipids Have your blood tested for lipids and cholesterol at 33 years of age, then have this test every 5 years. Have your cholesterol levels checked more often if:  Your lipid or cholesterol levels are high.  You are older than 33 years of age.  You are at high risk for heart disease. What should I know about cancer screening? Depending on your health history and family history, you may need to have cancer screening at various ages. This may include screening for:  Breast cancer.  Cervical cancer.  Colorectal cancer.  Skin cancer.  Lung cancer. What should I know about heart disease, diabetes, and high blood pressure? Blood pressure and heart disease  High blood pressure causes heart disease and increases the risk of stroke. This is more likely to develop in people who have high blood pressure readings, are of African descent, or are overweight.  Have your blood pressure checked: ? Every 3-5 years if you are 7318-33 years of age. ? Every year if you are 33 years old or older. Diabetes Have regular diabetes screenings. This checks your fasting blood sugar level. Have the  screening done:  Once every three years after age 33 if you are at a normal weight and have a low risk for diabetes.  More often and at a younger age if you are overweight or have a high risk for diabetes. What should I know about preventing infection? Hepatitis B If you have a higher risk for hepatitis B, you should be screened for this virus. Talk with your health care provider to find out if you are at risk for hepatitis B infection. Hepatitis C Testing is recommended for:  Everyone born from [id number] through 1963.  Anyone with known risk factors for hepatitis C. Sexually transmitted infections (STIs)  Get screened for STIs, including gonorrhea and chlamydia, if: ? You are sexually active and are younger than 33 years of age. ? You are older than 33 years of age and your health care provider tells you that you are at risk for this type of infection. ? Your sexual activity has  changed since you were last screened, and you are at increased risk for chlamydia or gonorrhea. Ask your health care provider if you are at risk.  Ask your health care provider about whether you are at high risk for HIV. Your health care provider may recommend a prescription medicine to help prevent HIV infection. If you choose to take medicine to prevent HIV, you should first get tested for HIV. You should then be tested every 3 months for as long as you are taking the medicine. Pregnancy  If you are about to stop having your period (premenopausal) and you may become pregnant, seek counseling before you get pregnant.  Take 400 to 800 micrograms (mcg) of folic acid every day if you become pregnant.  Ask for birth control (contraception) if you want to prevent pregnancy. Osteoporosis and menopause Osteoporosis is a disease in which the bones lose minerals and strength with aging. This can result in bone fractures. If you are 38 years old or older, or if you are at risk for osteoporosis and fractures, ask your health  care provider if you should:  Be screened for bone loss.  Take a calcium or vitamin D supplement to lower your risk of fractures.  Be given hormone replacement therapy (HRT) to treat symptoms of menopause. Follow these instructions at home: Lifestyle  Do not use any products that contain nicotine or tobacco, such as cigarettes, e-cigarettes, and chewing tobacco. If you need help quitting, ask your health care provider.  Do not use street drugs.  Do not share needles.  Ask your health care provider for help if you need support or information about quitting drugs. Alcohol use  Do not drink alcohol if: ? Your health care provider tells you not to drink. ? You are pregnant, may be pregnant, or are planning to become pregnant.  If you drink alcohol: ? Limit how much you use to 0-1 drink a day. ? Limit intake if you are breastfeeding.  Be aware of how much alcohol is in your drink. In the [place] one drink equals one 12 oz bottle of beer (355 mL), one 5 oz glass of wine (148 mL), or one 1 oz glass of hard liquor (44 mL). General instructions  Schedule regular health, dental, and eye exams.  Stay current with your vaccines.  Tell your health care provider if: ? You often feel depressed. ? You have ever been abused or do not feel safe at home. Summary  Adopting a healthy lifestyle and getting preventive care are important in promoting health and wellness.  Follow your health care provider's instructions about healthy diet, exercising, and getting tested or screened for diseases.  Follow your health care provider's instructions on monitoring your cholesterol and blood pressure. This information is not intended to replace advice given to you by your health care provider. Make sure you discuss any questions you have with your health care provider. Document Revised: 10/23/2018 Document Reviewed: 10/23/2018 Elsevier Patient Education  2021 Elsevier Inc. Breast Self-Awareness Breast  self-awareness is knowing how your breasts look and feel. Doing breast self-awareness is important. It allows you to catch a breast problem early while it is still small and can be treated. All women should do breast self-awareness, including women who have had breast implants. Tell your doctor if you notice a change in your breasts. What you need:  A mirror.  A well-lit room. How to do a breast self-exam A breast self-exam is one way to learn what is normal for  your breasts and to check for changes. To do a breast self-exam: Look for changes 1. Take off all the clothes above your waist. 2. Stand in front of a mirror in a room with good lighting. 3. Put your hands on your hips. 4. Push your hands down. 5. Look at your breasts and nipples in the mirror to see if one breast or nipple looks different from the other. Check to see if: ? The shape of one breast is different. ? The size of one breast is different. ? There are wrinkles, dips, and bumps in one breast and not the other. 6. Look at each breast for changes in the skin, such as: ? Redness. ? Scaly areas. 7. Look for changes in your nipples, such as: ? Liquid around the nipples. ? Bleeding. ? Dimpling. ? Redness. ? A change in where the nipples are.   Feel for changes 1. Lie on your back on the floor. 2. Feel each breast. To do this, follow these steps: ? Pick a breast to feel. ? Put the arm closest to that breast above your head. ? Use your other arm to feel the nipple area of your breast. Feel the area with the pads of your three middle fingers by making small circles with your fingers. For the first circle, press lightly. For the second circle, press harder. For the third circle, press even harder. ? Keep making circles with your fingers at the different pressures as you move down your breast. Stop when you feel your ribs. ? Move your fingers a little toward the center of your body. ? Start making circles with your fingers  again, this time going up until you reach your collarbone. ? Keep making up-and-down circles until you reach your armpit. Remember to keep using the three pressures. ? Feel the other breast in the same way. 3. Sit or stand in the tub or shower. 4. With soapy water on your skin, feel each breast the same way you did in step 2 when you were lying on the floor.   Write down what you find Writing down what you find can help you remember what to tell your doctor. Write down:  What is normal for each breast.  Any changes you find in each breast, including: ? The kind of changes you find. ? Whether you have pain. ? Size and location of any lumps.  When you last had your menstrual period. General tips  Check your breasts every month.  If you are breastfeeding, the best time to check your breasts is after you feed your baby or after you use a breast pump.  If you get menstrual periods, the best time to check your breasts is 5-7 days after your menstrual period is over.  With time, you will become comfortable with the self-exam, and you will begin to know if there are changes in your breasts. Contact a doctor if you:  See a change in the shape or size of your breasts or nipples.  See a change in the skin of your breast or nipples, such as red or scaly skin.  Have fluid coming from your nipples that is not normal.  Find a lump or thick area that was not there before.  Have pain in your breasts.  Have any concerns about your breast health. Summary  Breast self-awareness includes looking for changes in your breasts, as well as feeling for changes within your breasts.  Breast self-awareness should be done in front of  a mirror in a well-lit room.  You should check your breasts every month. If you get menstrual periods, the best time to check your breasts is 5-7 days after your menstrual period is over.  Let your doctor know of any changes you see in your breasts, including changes in  size, changes on the skin, pain or tenderness, or fluid from your nipples that is not normal. This information is not intended to replace advice given to you by your health care provider. Make sure you discuss any questions you have with your health care provider. Document Revised: 06/18/2018 Document Reviewed: 06/18/2018 Elsevier Patient Education  2021 [employer].

## 2021-04-15 LAB — LIPID PANEL
Cholesterol: 166 mg/dL (ref 0–200)
HDL: 71.1 mg/dL (ref >39.00)
LDL Cholesterol: 79 mg/dL (ref 0–99)
NonHDL: 95
Total CHOL/HDL Ratio: 2
Triglycerides: 78 mg/dL (ref 0.0–149.0)
VLDL: 15.6 mg/dL (ref 0.0–40.0)

## 2021-04-15 LAB — COMPREHENSIVE METABOLIC PANEL
ALT: 10 U/L (ref 0–35)
AST: 12 U/L (ref 0–37)
Albumin: 3.9 g/dL (ref 3.5–5.2)
Alkaline Phosphatase: 98 U/L (ref 39–117)
BUN: 11 mg/dL (ref 6–23)
CO2: 21 mEq/L (ref 19–32)
Calcium: 9.2 mg/dL (ref 8.4–10.5)
Chloride: 105 mEq/L (ref 96–112)
Creatinine, Ser: 0.7 mg/dL (ref 0.40–1.20)
GFR: 113.88 mL/min (ref >60.00)
Glucose, Bld: 77 mg/dL (ref 70–99)
Potassium: 4.3 mEq/L (ref 3.5–5.1)
Sodium: 139 mEq/L (ref 135–145)
Total Bilirubin: 0.3 mg/dL (ref 0.2–1.2)
Total Protein: 7.3 g/dL (ref 6.0–8.3)

## 2021-04-15 LAB — HGB A1C W/O EAG: Hgb A1c MFr Bld: 5.3 % (ref 4.8–5.6)

## 2021-04-18 ENCOUNTER — Telehealth: Payer: Self-pay

## 2021-04-18 DIAGNOSIS — Z1389 Encounter for screening for other disorder: Secondary | ICD-10-CM

## 2021-04-18 NOTE — Telephone Encounter (Signed)
Ordered urine culture for future labs

## 2021-04-18 NOTE — Progress Notes (Signed)
Cholesterol is within normal limits. CMP within normal limits. Hemoglobin A1C is within normal limits.  Vitamin D within normal. TSH for thyroid within normal limits.  Few bacteria in urine microscopic, if any urinary symptoms please have her returm for a urine culture by appointment ok to place order.CBC ok.

## 2021-04-22 ENCOUNTER — Encounter: Payer: Self-pay | Admitting: Adult Health

## 2021-04-22 NOTE — Telephone Encounter (Signed)
Please contact the patient.  Has she had numbness or weakness in her legs?  Is she still having the tingling as if her legs are waking up from falling asleep?  Has she had any chest pain, shortness of breath, or trouble breathing when laying down?

## 2021-04-22 NOTE — Telephone Encounter (Signed)
Called pt and the call dropped. Called pt back and pt didn't answer. Left message to call back.

## 2021-04-22 NOTE — Telephone Encounter (Signed)
At this point I rec pt be evaluated in person at [facility] or [facility] asap Leg/ankle swelling we need to make sure no blood clot Make sure has f/u with PCP after urgent care evaluation

## 2021-04-23 ENCOUNTER — Ambulatory Visit
Admission: RE | Admit: 2021-04-23 | Discharge: 2021-04-23 | Disposition: A | Discharge status: Home / Self Care | Payer: Managed Care, Other (non HMO) | Source: Ambulatory Visit | Attending: Internal Medicine | Admitting: Internal Medicine

## 2021-04-23 ENCOUNTER — Other Ambulatory Visit: Payer: Self-pay

## 2021-04-23 VITALS — BP 153/99 | HR 92 | Temp 99.3°F | Resp 15 | Ht 63.0 in | Wt 238.0 lb

## 2021-04-23 DIAGNOSIS — R609 Edema, unspecified: Secondary | ICD-10-CM

## 2021-04-23 MED ORDER — TRIAMTERENE-HCTZ 37.5-25 MG PO TABS: 1.0000 | ORAL_TABLET | Freq: Every day | ORAL | 0 refills | Status: DC

## 2021-04-23 NOTE — ED Triage Notes (Signed)
Patient reports some swelling in her ankles and lower leg pain that started on Thursday.  Patient denies any fall or injury.

## 2021-04-23 NOTE — ED Provider Notes (Signed)
[facility]    CSN: [id number] Arrival date & time: 04/23/21  0865      History   Chief Complaint Chief Complaint  Patient presents with  . Leg Pain    HPI Maureen Reed is a 35 y.o. female who presents with swelling of her ankles and also having lower leg pain x 2 days. 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 Initially was having tingling on her legs but that is gone and her shins were hurting. The swelling is not as bad today. She still feels her lower legs feel tight. She does not know if she snores, but her mother does not have sleep apnea and her father will be tested for that.  She had a complete physical with labs one week ago and was normal.     Past Medical History:  Diagnosis Date  . Allergy     Patient Active Problem List   Diagnosis Date Noted  . Family history of breast cancer 06/02/2020  . Vitamin D  06/02/2020  . Screening for blood or protein in urine 06/02/2020  . H/O left wrist surgery- with hardware.  06/02/2020  . Closed fracture of left distal radius 03/07/2019  . BMI 40.0-44.9, adult (HCC) 10/01/2012    Past Surgical History:  Procedure Laterality Date  . WRIST SURGERY Left    2020    OB History   No obstetric history on file.      Home Medications    Prior to Admission medications   Medication Sig Start Date End Date Taking? Authorizing Provider  drospirenone-ethinyl estradiol (YASMIN,ZARAH,SYEDA) 3-0.03 MG tablet Take by mouth. 09/16/18 04/23/21 Yes [provider]  triamterene-hydrochlorothiazide (MAXZIDE-25) 37.5-25 MG tablet Take 1 tablet by mouth daily. For edema 04/23/21  Yes Rodriguez-Southworth, Nettie Elm, PA-C  fluticasone (FLONASE) 50 MCG/ACT nasal spray Place 2 sprays into both nostrils daily. 09/20/18   Ralene Muskrat, FNP    Family History Family History  Problem Relation Age of Onset  . Hypertension Mother   . Breast cancer Mother 10  . Skin cancer  Mother   . Diabetes Mellitus II Mother   . Atrial fibrillation Mother   . Asthma Father   . Hypertension Maternal Aunt   . Breast cancer Maternal Aunt 64  . Ulcerative colitis Paternal Aunt   . Diabetes Mellitus II Paternal Aunt   . Cataracts Paternal Aunt   . Anemia Paternal Aunt   . Parkinson's disease Paternal Aunt   . Hypertension Paternal Aunt   . Hyperlipidemia Paternal Aunt   . Fibroids Paternal Aunt   . Other Paternal Aunt   . Heart disease Maternal Grandmother   . Stroke Maternal Grandmother   . Esophageal cancer Maternal Grandfather   . Liver cancer Maternal Grandfather   . Hyperlipidemia Maternal Grandfather   . Hypertension Maternal Grandfather   . Parkinson's disease Paternal Grandmother   . Anemia Paternal Grandmother   . Hypotension Paternal Grandmother   . Cataracts Paternal Grandmother   . Breast cancer Paternal Grandfather   . Diabetes Mellitus II Paternal Grandfather   . Heart disease Paternal Grandfather   . Liver cancer Paternal Grandfather     Social History Social History   Tobacco Use  . Smoking status: Never  . Smokeless tobacco: Never  Substance Use Topics  . Alcohol use: Yes    Alcohol/week: 1.0 standard drink    Types: 1 Glasses of wine per week  . Drug use: Never  Allergies   Other   Review of Systems Review of Systems  Constitutional:  Negative for diaphoresis and fever.  Respiratory:  Negative for cough and shortness of breath.   Cardiovascular:  Negative for chest pain.  Musculoskeletal:  Negative for back pain.  Skin:  Negative for color change, rash and wound.  Neurological:  Negative for weakness.    Physical Exam Triage Vital Signs ED Triage Vitals  Enc Vitals Group     BP 04/23/21 0859 (!) 153/99     Pulse Rate 04/23/21 0859 92     Resp 04/23/21 0859 15     Temp 04/23/21 0859 99.3 F (37.4 C)     Temp Source 04/23/21 0859 Oral     SpO2 04/23/21 0859 97 %     Weight 04/23/21 0857 238 lb (108 kg)     Height  04/23/21 0857 5\' 3"  (1.6 m)     Head Circumference --      Peak Flow --      Pain Score 04/23/21 0857 1     Pain Loc --      Pain Edu? --      Excl. in GC? --    No data found.  Updated Vital Signs BP (!) 153/99 (BP Location: Left Arm)   Pulse 92   Temp 99.3 F (37.4 C) (Oral)   Resp 15   Ht 5\' 3"  (1.6 m)   Wt 238 lb (108 kg)   LMP 04/05/2021   SpO2 97%   BMI 42.16 kg/m   Visual Acuity Right Eye Distance:   Left Eye Distance:   Bilateral Distance:    Right Eye Near:   Left Eye Near:    Bilateral Near:     Physical Exam Constitutional:      General: She is not in acute distress.    Appearance: She is obese. She is not toxic-appearing.  HENT:     Head: Normocephalic.     Right Ear: External ear normal.     Left Ear: External ear normal.  Eyes:     General: No scleral icterus.    Conjunctiva/sclera: Conjunctivae normal.  Cardiovascular:     Rate and Rhythm: Normal rate and regular rhythm.     Heart sounds: No murmur heard. Pulmonary:     Effort: Pulmonary effort is normal.  Musculoskeletal:        General: No swelling. Normal range of motion.     Cervical back: Neck supple.     Right lower leg: Edema present.     Left lower leg: Edema present.     Comments: + 1/4 pitting edema of ankles  Skin:    General: Skin is warm and dry.     Findings: No rash.  Neurological:     Mental Status: She is alert and oriented to person, place, and time.     Gait: Gait normal.     Deep Tendon Reflexes: Reflexes normal.  Psychiatric:        Mood and Affect: Mood normal.        Behavior: Behavior normal.        Thought Content: Thought content normal.        Judgment: Judgment normal.   BP repeated 128/83   UC Treatments / Results  Labs (all labs ordered are listed, but only abnormal results are displayed) Labs Reviewed - No data to display  EKG   Radiology No results found.  Procedures Procedures (including critical care time)  Medications Ordered  in  UC Medications - No data to display  Initial Impression / Assessment and Plan / UC Course  I have reviewed the triage vital signs and the nursing notes. I believe her edema is related to the head, obesity and standing. Advised to avoid processed food, high sodium foods, have her parents listen if she snores and if so will need to Fu with PCP.  In the mean time I placed her on Maxide for 3 days as noted.    Final Clinical Impressions(s) / UC Diagnoses   Final diagnoses:  Edema, unspecified type     Discharge Instructions      If the edema keeps on coming back, please follow with your primary care doctor     ED Prescriptions     Medication Sig Dispense Auth. Provider   triamterene-hydrochlorothiazide (MAXZIDE-25) 37.5-25 MG tablet Take 1 tablet by mouth daily. For edema 3 tablet Rodriguez-Southworth, Nettie Elm, PA-C      PDMP not reviewed this encounter.   Garey Ham, [place] 04/23/21 3710

## 2021-04-23 NOTE — Discharge Instructions (Addendum)
If the edema keeps on coming back, please follow with your primary care doctor

## 2021-04-25 ENCOUNTER — Ambulatory Visit (INDEPENDENT_AMBULATORY_CARE_PROVIDER_SITE_OTHER): Payer: Managed Care, Other (non HMO) | Admitting: Adult Health

## 2021-04-25 ENCOUNTER — Encounter: Payer: Self-pay | Admitting: Adult Health

## 2021-04-25 ENCOUNTER — Other Ambulatory Visit: Payer: Self-pay

## 2021-04-25 VITALS — BP 134/84 | HR 88 | Temp 98.1°F | Ht 62.99 in | Wt 233.8 lb

## 2021-04-25 DIAGNOSIS — R600 Localized edema: Secondary | ICD-10-CM | POA: Insufficient documentation

## 2021-04-25 DIAGNOSIS — R609 Edema, unspecified: Secondary | ICD-10-CM | POA: Insufficient documentation

## 2021-04-25 NOTE — Progress Notes (Signed)
Acute Office Visit  Subjective:    [name], female    DOB: December 27, 1985, 35 y.o.   MRN: [id number]  Chief Complaint  Patient presents with   Follow-up    Pt went to [facility] urgent care for Leg swelling and was given maxzide for the swelling. Pt reports no swelling today    HPI Patient is in today for swelling in her lower extremities, she had worn some leggings that were too tight. She went to urgent care for and given Maxzide for three days, all edema resolved.   She has been outside and helping dad build a deck and she has had some increased sodium in diet.  Feels well.   Patient  denies any fever, body aches,chills, rash, chest pain, shortness of breath, nausea, vomiting, or diarrhea.  Denies dizziness, lightheadedness, pre syncopal or syncopal episodes.    Past Medical History:  Diagnosis Date   Allergy     Past Surgical History:  Procedure Laterality Date   WRIST SURGERY Left    2020    Family History  Problem Relation Age of Onset   Hypertension Mother    Breast cancer Mother 43   Skin cancer Mother    Diabetes Mellitus II Mother    Atrial fibrillation Mother    Asthma Father    Hypertension Maternal Aunt    Breast cancer Maternal Aunt 16   Ulcerative colitis Paternal Aunt    Diabetes Mellitus II Paternal Aunt    Cataracts Paternal Aunt    Anemia Paternal Aunt    Parkinson's disease Paternal Aunt    Hypertension Paternal Aunt    Hyperlipidemia Paternal Aunt    Fibroids Paternal Aunt    Other Paternal Aunt    Heart disease Maternal Grandmother    Stroke Maternal Grandmother    Esophageal cancer Maternal Grandfather    Liver cancer Maternal Grandfather    Hyperlipidemia Maternal Grandfather    Hypertension Maternal Grandfather    Parkinson's disease Paternal Grandmother    Anemia Paternal Grandmother    Hypotension Paternal Grandmother    Cataracts Paternal Grandmother    Breast cancer Paternal Grandfather    Diabetes Mellitus II  Paternal Grandfather    Heart disease Paternal Grandfather    Liver cancer Paternal Grandfather     Social History   Socioeconomic History   Marital status: Single    Spouse name: Not on file   Number of children: Not on file   Years of education: Not on file   Highest education level: Not on file  Occupational History   Not on file  Tobacco Use   Smoking status: Never   Smokeless tobacco: Never  Substance and Sexual Activity   Alcohol use: Yes    Alcohol/week: 1.0 standard drink    Types: 1 Glasses of wine per week   Drug use: Never   Sexual activity: Not Currently  Other Topics Concern   Not on file  Social History Narrative   Not on file   Social Determinants of Health   Financial Resource Strain: Not on file  Food Insecurity: Not on file  Transportation Needs: Not on file  Physical Activity: Not on file  Stress: Not on file  Social Connections: Not on file  Intimate Partner Violence: Not on file    Outpatient Medications Prior to Visit  Medication Sig Dispense Refill   fluticasone (FLONASE) 50 MCG/ACT nasal spray Place 2 sprays into both nostrils daily. 16 g 0   drospirenone-ethinyl estradiol (  YASMIN,ZARAH,SYEDA) 3-0.03 MG tablet Take by mouth.     triamterene-hydrochlorothiazide (MAXZIDE-25) 37.5-25 MG tablet Take 1 tablet by mouth daily. For edema (Patient not taking: Reported on 04/25/2021) 3 tablet 0   No facility-administered medications prior to visit.    Allergies  Allergen Reactions   Other Other (See Comments)    Seasonal    Review of Systems  Constitutional: Negative.   HENT: Negative.    Respiratory: Negative.    Cardiovascular: Negative.       Objective:    Physical Exam Vitals reviewed.  Constitutional:      General: She is not in acute distress.    Appearance: She is well-developed. She is obese. She is not diaphoretic.     Interventions: She is not intubated.    Comments: Patient appers well, not sickly. Speaking in complete  sentences. Patient moves on and off of exam table and in room without difficulty. Gait is normal in hall and in room. Patient is oriented to person place time and situation. Patient answers questions appropriately and engages eye contact and verbal dialect with provider.    HENT:     Head: Normocephalic and atraumatic.     Right Ear: External ear normal.     Left Ear: External ear normal.     Nose: Nose normal.     Mouth/Throat:     Pharynx: No oropharyngeal exudate.  Eyes:     General: Lids are normal. No scleral icterus.       Right eye: No discharge.        Left eye: No discharge.     Conjunctiva/sclera: Conjunctivae normal.     Right eye: Right conjunctiva is not injected. No exudate or hemorrhage.    Left eye: Left conjunctiva is not injected. No exudate or hemorrhage.    Pupils: Pupils are equal, round, and reactive to light.  Neck:     Thyroid: No thyroid mass or thyromegaly.     Vascular: Normal carotid pulses. No carotid bruit, hepatojugular reflux or JVD.     Trachea: Trachea and phonation normal. No tracheal tenderness or tracheal deviation.     Meningeal: Brudzinski's sign and Kernig's sign absent.  Cardiovascular:     Rate and Rhythm: Normal rate and regular rhythm.     Pulses: Normal pulses.          Radial pulses are 2+ on the right side and 2+ on the left side.       Dorsalis pedis pulses are 2+ on the right side and 2+ on the left side.       Posterior tibial pulses are 2+ on the right side and 2+ on the left side.     Heart sounds: Normal heart sounds, S1 normal and S2 normal. Heart sounds not distant. No murmur heard.   No friction rub. No gallop.  Pulmonary:     Effort: Pulmonary effort is normal. No tachypnea, bradypnea, accessory muscle usage or respiratory distress. She is not intubated.     Breath sounds: Normal breath sounds. No stridor. No wheezing or rales.  Chest:     Chest wall: No tenderness.  Breasts:    Right: No supraclavicular adenopathy.      Left: No supraclavicular adenopathy.  Abdominal:     General: Bowel sounds are normal. There is no distension or abdominal bruit.     Palpations: Abdomen is soft. There is no shifting dullness, fluid wave, hepatomegaly, splenomegaly, mass or pulsatile mass.     Tenderness: There  is no abdominal tenderness. There is no guarding or rebound.     Hernia: No hernia is present.  Musculoskeletal:        General: No tenderness or deformity. Normal range of motion.     Cervical back: Full passive range of motion without pain, normal range of motion and neck supple. No edema, erythema or rigidity. No spinous process tenderness or muscular tenderness. Normal range of motion.     Right lower leg: No edema.     Left lower leg: No edema.  Lymphadenopathy:     Head:     Right side of head: No submental, submandibular, tonsillar, preauricular, posterior auricular or occipital adenopathy.     Left side of head: No submental, submandibular, tonsillar, preauricular, posterior auricular or occipital adenopathy.     Cervical: No cervical adenopathy.     Right cervical: No superficial, deep or posterior cervical adenopathy.    Left cervical: No superficial, deep or posterior cervical adenopathy.     Upper Body:     Right upper body: No supraclavicular or pectoral adenopathy.     Left upper body: No supraclavicular or pectoral adenopathy.  Skin:    General: Skin is warm and dry.     Coloration: Skin is not pale.     Findings: No abrasion, bruising, burn, ecchymosis, erythema, lesion, petechiae or rash.     Nails: There is no clubbing.  Neurological:     Mental Status: She is alert and oriented to person, place, and time.     GCS: GCS eye subscore is 4. GCS verbal subscore is 5. GCS motor subscore is 6.     Cranial Nerves: No cranial nerve deficit.     Sensory: No sensory deficit.     Motor: No tremor, atrophy, abnormal muscle tone or seizure activity.     Coordination: Coordination normal.     Gait: Gait  normal.     Deep Tendon Reflexes: Reflexes are normal and symmetric. Reflexes normal. Babinski sign absent on the right side. Babinski sign absent on the left side.     Reflex Scores:      Tricep reflexes are 2+ on the right side and 2+ on the left side.      Bicep reflexes are 2+ on the right side and 2+ on the left side.      Brachioradialis reflexes are 2+ on the right side and 2+ on the left side.      Patellar reflexes are 2+ on the right side and 2+ on the left side.      Achilles reflexes are 2+ on the right side and 2+ on the left side. Psychiatric:        Speech: Speech normal.        Behavior: Behavior normal.        Thought Content: Thought content normal.        Judgment: Judgment normal.    BP 134/84 (BP Location: Left Arm, Patient Position: Sitting)   Pulse 88   Temp 98.1 F (36.7 C)   Ht 5' 2.99" (1.6 m)   Wt 233 lb 12.8 oz (106.1 kg)   LMP 04/05/2021   SpO2 97%   BMI 41.43 kg/m  Wt Readings from Last 3 Encounters:  04/25/21 233 lb 12.8 oz (106.1 kg)  04/23/21 238 lb (108 kg)  04/14/21 238 lb 12.8 oz (108.3 kg)    There are no preventive care reminders to display for this patient.  There are no preventive care reminders  to display for this patient.   Lab Results  Component Value Date   TSH 3.65 04/14/2021   Lab Results  Component Value Date   WBC 11.2 (H) 04/14/2021   HGB 12.9 04/14/2021   HCT 39.1 04/14/2021   MCV 86.4 04/14/2021   PLT 307.0 04/14/2021   Lab Results  Component Value Date   NA 139 04/14/2021   K 4.3 04/14/2021   CO2 21 04/14/2021   GLUCOSE 77 04/14/2021   BUN 11 04/14/2021   CREATININE 0.70 04/14/2021   BILITOT 0.3 04/14/2021   ALKPHOS 98 04/14/2021   AST 12 04/14/2021   ALT 10 04/14/2021   PROT 7.3 04/14/2021   ALBUMIN 3.9 04/14/2021   CALCIUM 9.2 04/14/2021   GFR 113.88 04/14/2021   Lab Results  Component Value Date   CHOL 166 04/14/2021   Lab Results  Component Value Date   HDL 71.10 04/14/2021   Lab Results   Component Value Date   LDLCALC 79 04/14/2021   Lab Results  Component Value Date   TRIG 78.0 04/14/2021   Lab Results  Component Value Date   CHOLHDL 2 04/14/2021   Lab Results  Component Value Date   HGBA1C 5.3 04/14/2021       Assessment & Plan:   Problem List Items Addressed This Visit       Other   Peripheral edema - Primary  She feels this was transient edema due to environmental factors. Diet / lifestyle changes.  No edema today at visit.  No orders of the defined types were placed in this encounter.   Labs recently done 04/14/21 within normal limits.  Return in about 3 months (around 07/26/2021), or if symptoms worsen or fail to improve, for at any time for any worsening symptoms, Go to Emergency room/ urgent care if worse.  Red Flags discussed. The patient was given clear instructions to go to ER or return to medical center if any red flags develop, symptoms do not improve, worsen or new problems develop. They verbalized understanding.   No orders of the defined types were placed in this encounter.    Jairo Ben, FNP

## 2021-04-25 NOTE — Patient Instructions (Signed)
Peripheral Edema Peripheral edema is swelling that is caused by a buildup of fluid. Peripheral edema most often affects the lower legs, ankles, and feet. It can also develop in the arms, hands, and face. The area of the body that has peripheral edema will look swollen. It may also feel heavy or warm. Your clothes may start to feel tight. Pressing on the area may make a temporary dent in your skin. You may not be able to move your swollen arm or leg as much as usual. There are many causes of peripheral edema. It can happen because of a complication of other conditions such as congestive heart failure, kidney disease, or a problem with your blood circulation. It also can be a side effect of certain medicines or because of an infection. It often happens to women during pregnancy. Sometimes, the cause is not known. Follow these instructions at home: Managing pain, stiffness, and swelling  Raise (elevate) your legs while you are sitting or lying down. Move around often to prevent stiffness and to lessen swelling. Do not sit or stand for long periods of time. Wear support stockings as told by your health care provider. Medicines Take over-the-counter and prescription medicines only as told by your health care provider. Your health care provider may prescribe medicine to help your body get rid of excess water (diuretic). General instructions Pay attention to any changes in your symptoms. Follow instructions from your health care provider about limiting salt (sodium) in your diet. Sometimes, eating less salt may reduce swelling. Moisturize skin daily to help prevent skin from cracking and draining. Keep all follow-up visits as told by your health care provider. This is important. Contact a health care provider if you have: A fever. Edema that starts suddenly or is getting worse, especially if you are pregnant or have a medical condition. Swelling in only one leg. Increased swelling, redness, or pain in  one or both of your legs. Drainage or sores at the area where you have edema. Get help right away if you: Develop shortness of breath, especially when you are lying down. Have pain in your chest or abdomen. Feel weak. Feel faint. Summary Peripheral edema is swelling that is caused by a buildup of fluid. Peripheral edema most often affects the lower legs, ankles, and feet. Move around often to prevent stiffness and to lessen swelling. Do not sit or stand for long periods of time. Pay attention to any changes in your symptoms. Contact a health care provider if you have edema that starts suddenly or is getting worse, especially if you are pregnant or have a medical condition. Get help right away if you develop shortness of breath, especially when lying down. This information is not intended to replace advice given to you by your health care provider. Make sure you discuss any questions you have with your health care provider. Document Revised: 07/24/2018 Document Reviewed: 07/24/2018 Elsevier Patient Education  2022 Elsevier Inc.  

## 2021-05-05 NOTE — Telephone Encounter (Signed)
If persistent swelling, she needs to be reevaluated.

## 2021-05-17 ENCOUNTER — Telehealth: Payer: Self-pay

## 2021-05-17 NOTE — Telephone Encounter (Signed)
Please advise 

## 2021-05-17 NOTE — Telephone Encounter (Signed)
Pt had an appt with PCP and was supposed to follow up about edema. She states that she took a medication for three days for swelling and was needing a refill. She states that she had been corresponding with PCP through [facility] about this medication. Please advise.

## 2021-05-18 ENCOUNTER — Ambulatory Visit: Payer: Managed Care, Other (non HMO) | Admitting: Adult Health

## 2021-05-23 ENCOUNTER — Ambulatory Visit (INDEPENDENT_AMBULATORY_CARE_PROVIDER_SITE_OTHER): Payer: Managed Care, Other (non HMO) | Admitting: Internal Medicine

## 2021-05-23 ENCOUNTER — Encounter: Payer: Self-pay | Admitting: Internal Medicine

## 2021-05-23 ENCOUNTER — Other Ambulatory Visit: Payer: Self-pay

## 2021-05-23 DIAGNOSIS — R030 Elevated blood-pressure reading, without diagnosis of hypertension: Secondary | ICD-10-CM

## 2021-05-23 DIAGNOSIS — M25473 Effusion, unspecified ankle: Secondary | ICD-10-CM

## 2021-05-23 NOTE — Progress Notes (Signed)
[name], female   DOB: 1986/09/24, 35 y.o.   MRN: [id number]   Subjective:    [name], female    DOB: 05-19-1986, 35 y.o.   MRN: [id number]  HPI This visit occurred during the SARS-CoV-2 public health emergency.  Safety protocols were in place, including screening questions prior to the visit, additional usage of staff PPE, and extensive cleaning of exam room while observing appropriate contact time as indicated for disinfecting solutions.   Patient here for work in appt.  Work in for lower extremity swelling.  Was originally evaluated at [facility] urgent care (04/23/21) - for ankle swelling and documented leg pain. Was given maxzide for three days.  Saw Michelle Flinchum for follow up 04/25/21.  Swelling improved.  No changes made.  She comes in today stating she has noticed more swelling recently.  She had previously been working outside - building a deck.  Not working outside now.  Has decreased sodium intake.  Discussed compression hose.  No sob.  No chest pain.  No nausea or vomiting reported.  Previously noticed finger swelling.  This is better.  Trying to stay hydrated.    Past Medical History:  Diagnosis Date   Allergy    Past Surgical History:  Procedure Laterality Date   WRIST SURGERY Left    2020   Family History  Problem Relation Age of Onset   Hypertension Mother    Breast cancer Mother 48   Skin cancer Mother    Diabetes Mellitus II Mother    Atrial fibrillation Mother    Asthma Father    Hypertension Maternal Aunt    Breast cancer Maternal Aunt 67   Ulcerative colitis Paternal Aunt    Diabetes Mellitus II Paternal Aunt    Cataracts Paternal Aunt    Anemia Paternal Aunt    Parkinson's disease Paternal Aunt    Hypertension Paternal Aunt    Hyperlipidemia Paternal Aunt    Fibroids Paternal Aunt    Other Paternal Aunt    Heart disease Maternal Grandmother    Stroke Maternal Grandmother    Esophageal cancer Maternal Grandfather    Liver  cancer Maternal Grandfather    Hyperlipidemia Maternal Grandfather    Hypertension Maternal Grandfather    Parkinson's disease Paternal Grandmother    Anemia Paternal Grandmother    Hypotension Paternal Grandmother    Cataracts Paternal Grandmother    Breast cancer Paternal Grandfather    Diabetes Mellitus II Paternal Grandfather    Heart disease Paternal Grandfather    Liver cancer Paternal Grandfather    Social History   Socioeconomic History   Marital status: Single    Spouse name: Not on file   Number of children: Not on file   Years of education: Not on file   Highest education level: Not on file  Occupational History   Not on file  Tobacco Use   Smoking status: Never   Smokeless tobacco: Never  Substance and Sexual Activity   Alcohol use: Yes    Alcohol/week: 1.0 standard drink    Types: 1 Glasses of wine per week   Drug use: Never   Sexual activity: Not Currently  Other Topics Concern   Not on file  Social History Narrative   Not on file   Social Determinants of Health   Financial Resource Strain: Not on file  Food Insecurity: Not on file  Transportation Needs: Not on file  Physical Activity: Not on file  Stress: Not on file  Social Connections: Not on file    Outpatient Encounter Medications as of 05/23/2021  Medication Sig   fluticasone (FLONASE) 50 MCG/ACT nasal spray Place 2 sprays into both nostrils daily.   drospirenone-ethinyl estradiol (YASMIN,ZARAH,SYEDA) 3-0.03 MG tablet Take by mouth.   No facility-administered encounter medications on file as of 05/23/2021.     Review of Systems  Constitutional:  Negative for appetite change and unexpected weight change.  HENT:  Negative for congestion and sinus pressure.   Respiratory:  Negative for cough, chest tightness and shortness of breath.   Cardiovascular:  Negative for chest pain and palpitations.       Reported pedal and ankle swelling.    Gastrointestinal:  Negative for abdominal pain, diarrhea,  nausea and vomiting.  Genitourinary:  Negative for difficulty urinating, dysuria and frequency.  Musculoskeletal:  Negative for joint swelling and myalgias.  Skin:  Negative for color change and rash.  Neurological:  Negative for dizziness, light-headedness and headaches.  Psychiatric/Behavioral:  Negative for agitation and dysphoric mood.       Objective:    Physical Exam Vitals reviewed.  Constitutional:      General: She is not in acute distress.    Appearance: Normal appearance.  HENT:     Head: Normocephalic and atraumatic.     Right Ear: External ear normal.     Left Ear: External ear normal.  Eyes:     General: No scleral icterus.       Right eye: No discharge.        Left eye: No discharge.     Conjunctiva/sclera: Conjunctivae normal.  Neck:     Thyroid: No thyromegaly.  Cardiovascular:     Rate and Rhythm: Normal rate and regular rhythm.  Pulmonary:     Effort: No respiratory distress.     Breath sounds: Normal breath sounds. No wheezing.  Abdominal:     General: Bowel sounds are normal.     Palpations: Abdomen is soft.     Tenderness: There is no abdominal tenderness.  Musculoskeletal:        General: No swelling or tenderness.     Cervical back: Neck supple. No tenderness.  Lymphadenopathy:     Cervical: No cervical adenopathy.  Skin:    Findings: No erythema or rash.  Neurological:     Mental Status: She is alert.  Psychiatric:        Mood and Affect: Mood normal.        Behavior: Behavior normal.    BP 124/90 (BP Location: Left Arm, Patient Position: Sitting)   Pulse 91   Temp (!) 97.2 F (36.2 C)   Ht 5' 2.99" (1.6 m)   Wt 240 lb 9.6 oz (109.1 kg)   SpO2 99%   BMI 42.63 kg/m  Wt Readings from Last 3 Encounters:  05/23/21 240 lb 9.6 oz (109.1 kg)  04/25/21 233 lb 12.8 oz (106.1 kg)  04/23/21 238 lb (108 kg)    Outpatient Encounter Medications as of 05/23/2021  Medication Sig   fluticasone (FLONASE) 50 MCG/ACT nasal spray Place 2 sprays  into both nostrils daily.   drospirenone-ethinyl estradiol (YASMIN,ZARAH,SYEDA) 3-0.03 MG tablet Take by mouth.   No facility-administered encounter medications on file as of 05/23/2021.     Lab Results  Component Value Date   WBC 11.2 (H) 04/14/2021   HGB 12.9 04/14/2021   HCT 39.1 04/14/2021   PLT 307.0 04/14/2021   GLUCOSE 77 04/14/2021   CHOL 166 04/14/2021   TRIG 78.0  04/14/2021   HDL 71.10 04/14/2021   LDLCALC 79 04/14/2021   ALT 10 04/14/2021   AST 12 04/14/2021   NA 139 04/14/2021   K 4.3 04/14/2021   CL 105 04/14/2021   CREATININE 0.70 04/14/2021   BUN 11 04/14/2021   CO2 21 04/14/2021   TSH 3.65 04/14/2021   HGBA1C 5.3 04/14/2021       Assessment & Plan:   Problem List Items Addressed This Visit     Ankle edema    Intermittent swelling as outlined.  Discussed continuing to stay hydrated.  Discussed continuing decreased sodium intake.  Elevate legs when sitting. Discussed compression hose.  Follow.  No significant swelling on exam today.        Elevated blood pressure reading    Blood pressure elevated.  Recheck elevated above goal.  Discussed monitoring her blood pressure.  Have her spot check her readings and send in for review.  If persistent elevation, will require medication. Continue decrease sodium intake.  Hold on starting medication today.           Dale [place], MD

## 2021-05-30 ENCOUNTER — Encounter: Payer: Self-pay | Admitting: Internal Medicine

## 2021-05-30 DIAGNOSIS — M25473 Effusion, unspecified ankle: Secondary | ICD-10-CM | POA: Insufficient documentation

## 2021-05-30 DIAGNOSIS — R030 Elevated blood-pressure reading, without diagnosis of hypertension: Secondary | ICD-10-CM | POA: Insufficient documentation

## 2021-05-30 NOTE — Assessment & Plan Note (Signed)
Intermittent swelling as outlined.  Discussed continuing to stay hydrated.  Discussed continuing decreased sodium intake.  Elevate legs when sitting. Discussed compression hose.  Follow.  No significant swelling on exam today.

## 2021-05-30 NOTE — Assessment & Plan Note (Addendum)
Blood pressure elevated.  Recheck elevated above goal.  Discussed monitoring her blood pressure.  Have her spot check her readings and send in for review.  If persistent elevation, will require medication. Continue decrease sodium intake.  Hold on starting medication today.

## 2021-05-31 NOTE — Telephone Encounter (Signed)
This is Maureen Reed's pt - I saw for work in appt.  I had asked her to spot check her pressure.  Reviewed her message.  I do agree that anxiety can contribute to elevation in her blood pressure.  Just have her spot check it (2-3x/week, or once daily if desires).  Checking multiple times per day, can contribute to anxiety and increase blood pressure.  The swelling does not appear to be a significant issue if wears the hose.  Continue compression hose - on in am and off in evening.  She can take her blood pressure cuff with her on her trip, but again checking blood pressure as above.  Let her know that Marcelino Duster is out of the office. I do feel she needs a f/u appt to f/u regarding her blood pressure, etc.

## 2021-06-01 NOTE — Telephone Encounter (Signed)
Patient is aware of below. She has an appt scheduled with Marcelino Duster on 8/29 to follow up on BP. She will continue to monitor and let us know if she feels like she needs to see someone sooner.

## 2021-06-02 ENCOUNTER — Encounter: Payer: Managed Care, Other (non HMO) | Admitting: Adult Health

## 2021-06-09 NOTE — Telephone Encounter (Signed)
Called patient and advised of below. Patient gave verbal understanding

## 2021-06-09 NOTE — Telephone Encounter (Signed)
Please call Janica.  She is one of Michelle's pts.  I saw her for work in appt previously.  Reviewed her message.  Regarding her leg swelling, have her continue to remain hydrated.  Also would recommend continuing compression hose - on in the morning and off in the evening.  It appears her swelling is ok if she wears hose and stays hydrated.  Also, regarding her blood pressure, it appears it is still varying.  Continue to monitor since she is back home now.  Will see how she does - back in her normal routine.  Per last note, has f/u with Marcelino Duster.  She can keep [place] posted on how she is doing.

## 2021-06-26 NOTE — Telephone Encounter (Signed)
This is one of Maureen Reed's pts.  She has been giving me updates since I saw her in the clinic. Please call and notify her that it appears her swelling is not a significant issue for her now.  Continue compression hose.  I appreciate her checking her pressures and after review, her pressures are not bad.  Continue to monitor.  If persistent 130s (especially if upper 130s) - may need to consider a low dose blood pressure medication.  We will follow.

## 2021-06-29 ENCOUNTER — Telehealth: Payer: Self-pay

## 2021-06-29 NOTE — Telephone Encounter (Signed)
LMTCB

## 2021-06-29 NOTE — Telephone Encounter (Signed)
Patient is aware of below. 

## 2021-06-29 NOTE — Telephone Encounter (Signed)
PT called to return the missed call 

## 2021-06-29 NOTE — Telephone Encounter (Signed)
I called pt to reschedule follow up with PCP. She states that she needs to see Dr Lorin Picket since she has been aware of her situation. I let her know that Dr Lorin Picket does not have anything available until October and offered her an appt with another provider. She states that she is not going to see anyone but Dr Lorin Picket or [facility]. She wants Dr Lorin Picket to tell her whether or not she can wait until October for follow up since it is concerning her blood pressure.

## 2021-07-01 ENCOUNTER — Encounter: Payer: Self-pay | Admitting: Adult Health

## 2021-07-01 NOTE — Telephone Encounter (Signed)
I do not mind working her in if I need to.  Her diastolic readings are occasionally a little higher than goal.  This is not urgent, but since remaining elevated, we can address before October.  She is on birth control.  Who prescribes?  Does she see gyn?  If so, can she discuss with them regarding elevated blood pressure and question of changing ocp's?  If she can d/w them and let us know the response.  Continue to spot check her pressure and let us know if any problems.

## 2021-07-01 NOTE — Telephone Encounter (Signed)
noted 

## 2021-07-01 NOTE — Telephone Encounter (Signed)
Are you ok to do a follow up with her in September?

## 2021-07-01 NOTE — Telephone Encounter (Signed)
See my chart message on this pt.

## 2021-07-04 ENCOUNTER — Other Ambulatory Visit: Payer: Self-pay | Admitting: Family

## 2021-07-04 MED ORDER — DROSPIRENONE-ETHINYL ESTRADIOL 3-0.03 MG PO TABS: 1.0000 | ORAL_TABLET | Freq: Every day | ORAL | 11 refills | Status: DC

## 2021-07-05 NOTE — Telephone Encounter (Signed)
See other my chart message.  Sent my chart message to Bethann Berkshire for work in appt.

## 2021-07-06 NOTE — Telephone Encounter (Signed)
Patient has been scheduled for follow up with Dr Lorin Picket. She does not have a gyn. Marcelino Duster will be taking over her ocp's. Oran Rein has refilled her birth control. Offered patient appt on 9/8 and she was unable to do this due to vacation so she is scheduled for 9/15. She is going to continue to spot check her pressures until appt and let us know if any problems

## 2021-07-08 NOTE — Telephone Encounter (Signed)
Reviewed notes.  She reported an episode of chest pressure - earlier in the week.  Apparently has not had since.  Can see if had any other symptoms and how long lasted and if had to take anything to relieve.  Given episode of chest pressure, I would recommend for her to be evaluated to see if can confirm etiology of pressure.

## 2021-07-08 NOTE — Telephone Encounter (Signed)
LMTCB

## 2021-07-11 ENCOUNTER — Ambulatory Visit: Payer: Managed Care, Other (non HMO) | Admitting: Adult Health

## 2021-07-28 ENCOUNTER — Other Ambulatory Visit: Payer: Self-pay

## 2021-07-28 ENCOUNTER — Encounter: Payer: Self-pay | Admitting: Internal Medicine

## 2021-07-28 ENCOUNTER — Ambulatory Visit (INDEPENDENT_AMBULATORY_CARE_PROVIDER_SITE_OTHER): Payer: Managed Care, Other (non HMO) | Admitting: Internal Medicine

## 2021-07-28 DIAGNOSIS — R609 Edema, unspecified: Secondary | ICD-10-CM | POA: Diagnosis not present

## 2021-07-28 DIAGNOSIS — Z6841 Body Mass Index (BMI) 40.0 and over, adult: Secondary | ICD-10-CM

## 2021-07-28 DIAGNOSIS — I10 Essential (primary) hypertension: Secondary | ICD-10-CM

## 2021-07-28 MED ORDER — HYDROCHLOROTHIAZIDE 12.5 MG PO CAPS: 12.5000 mg | ORAL_CAPSULE | Freq: Every day | ORAL | 1 refills | Status: DC

## 2021-07-28 NOTE — Progress Notes (Signed)
[name], female   DOB: 1986/07/13, 35 y.o.   MRN: [id number]   Subjective:    [name], female    DOB: 1986-10-12, 35 y.o.   MRN: [id number]  This visit occurred during the SARS-CoV-2 public health emergency.  Safety protocols were in place, including screening questions prior to the visit, additional usage of staff PPE, and extensive cleaning of exam room while observing appropriate contact time as indicated for disinfecting solutions.   Patient here for work in appt.    Chief Complaint  Patient presents with   Hypertension   .   HPI  Work in to follow up regarding her blood pressure.  Has been monitoring.  Reviewed readings from my chart messages.  Remaining a little elevated.  She is watching salt intake.  Increased stress.  Discussed.  Has cut out soft drinks.  No chest pain or sob reported.  No increased ankle/leg swelling.   Past Medical History:  Diagnosis Date   Allergy    Past Surgical History:  Procedure Laterality Date   WRIST SURGERY Left    2020   Family History  Problem Relation Age of Onset   Hypertension Mother    Breast cancer Mother 20   Skin cancer Mother    Diabetes Mellitus II Mother    Atrial fibrillation Mother    Asthma Father    Hypertension Maternal Aunt    Breast cancer Maternal Aunt 55   Ulcerative colitis Paternal Aunt    Diabetes Mellitus II Paternal Aunt    Cataracts Paternal Aunt    Anemia Paternal Aunt    Parkinson's disease Paternal Aunt    Hypertension Paternal Aunt    Hyperlipidemia Paternal Aunt    Fibroids Paternal Aunt    Other Paternal Aunt    Heart disease Maternal Grandmother    Stroke Maternal Grandmother    Esophageal cancer Maternal Grandfather    Liver cancer Maternal Grandfather    Hyperlipidemia Maternal Grandfather    Hypertension Maternal Grandfather    Parkinson's disease Paternal Grandmother    Anemia Paternal Grandmother    Hypotension Paternal Grandmother    Cataracts Paternal  Grandmother    Breast cancer Paternal Grandfather    Diabetes Mellitus II Paternal Grandfather    Heart disease Paternal Grandfather    Liver cancer Paternal Grandfather    Social History   Socioeconomic History   Marital status: Single    Spouse name: Not on file   Number of children: Not on file   Years of education: Not on file   Highest education level: Not on file  Occupational History   Not on file  Tobacco Use   Smoking status: Never   Smokeless tobacco: Never  Substance and Sexual Activity   Alcohol use: Yes    Alcohol/week: 1.0 standard drink    Types: 1 Glasses of wine per week   Drug use: Never   Sexual activity: Not Currently  Other Topics Concern   Not on file  Social History Narrative   Not on file   Social Determinants of Health   Financial Resource Strain: Not on file  Food Insecurity: Not on file  Transportation Needs: Not on file  Physical Activity: Not on file  Stress: Not on file  Social Connections: Not on file     Review of Systems  Constitutional:  Negative for appetite change and unexpected weight change.  HENT:  Negative for congestion and sinus pressure.   Respiratory:  Negative for  cough, chest tightness and shortness of breath.   Cardiovascular:  Negative for chest pain, palpitations and leg swelling.  Gastrointestinal:  Negative for abdominal pain, diarrhea, nausea and vomiting.  Genitourinary:  Negative for difficulty urinating and dysuria.  Musculoskeletal:  Negative for joint swelling and myalgias.  Skin:  Negative for color change and rash.  Neurological:  Negative for dizziness, light-headedness and headaches.  Psychiatric/Behavioral:  Negative for agitation and dysphoric mood.       Objective:     BP 132/84 (BP Location: Left Arm, Patient Position: Sitting, Cuff Size: Large)   Pulse 100   Temp 98.5 F (36.9 C) (Oral)   Ht 5\' 3"  (1.6 m)   Wt 245 lb 12.8 oz (111.5 kg)   SpO2 98%   BMI 43.54 kg/m  Wt Readings from Last  3 Encounters:  07/28/21 245 lb 12.8 oz (111.5 kg)  05/23/21 240 lb 9.6 oz (109.1 kg)  04/25/21 233 lb 12.8 oz (106.1 kg)    Physical Exam Vitals reviewed.  Constitutional:      General: She is not in acute distress.    Appearance: Normal appearance.  HENT:     Head: Normocephalic and atraumatic.     Right Ear: External ear normal.     Left Ear: External ear normal.  Eyes:     General: No scleral icterus.       Right eye: No discharge.        Left eye: No discharge.     Conjunctiva/sclera: Conjunctivae normal.  Neck:     Thyroid: No thyromegaly.  Cardiovascular:     Rate and Rhythm: Normal rate and regular rhythm.  Pulmonary:     Effort: No respiratory distress.     Breath sounds: Normal breath sounds. No wheezing.  Abdominal:     General: Bowel sounds are normal.     Palpations: Abdomen is soft.     Tenderness: There is no abdominal tenderness.  Musculoskeletal:        General: No swelling or tenderness.     Cervical back: Neck supple. No tenderness.  Lymphadenopathy:     Cervical: No cervical adenopathy.  Skin:    Findings: No erythema or rash.  Neurological:     Mental Status: She is alert.  Psychiatric:        Mood and Affect: Mood normal.        Behavior: Behavior normal.     Outpatient Encounter Medications as of 07/28/2021  Medication Sig   drospirenone-ethinyl estradiol (YASMIN) 3-0.03 MG tablet Take 1 tablet by mouth daily.   fluticasone (FLONASE) 50 MCG/ACT nasal spray Place 2 sprays into both nostrils daily.   hydrochlorothiazide (MICROZIDE) 12.5 MG capsule Take 1 capsule (12.5 mg total) by mouth daily.   loratadine (CLARITIN) 10 MG tablet Take 10 mg by mouth daily.   No facility-administered encounter medications on file as of 07/28/2021.     Lab Results  Component Value Date   WBC 11.2 (H) 04/14/2021   HGB 12.9 04/14/2021   HCT 39.1 04/14/2021   PLT 307.0 04/14/2021   GLUCOSE 77 04/14/2021   CHOL 166 04/14/2021   TRIG 78.0 04/14/2021   HDL  71.10 04/14/2021   LDLCALC 79 04/14/2021   ALT 10 04/14/2021   AST 12 04/14/2021   NA 139 04/14/2021   K 4.3 04/14/2021   CL 105 04/14/2021   CREATININE 0.70 04/14/2021   BUN 11 04/14/2021   CO2 21 04/14/2021   TSH 3.65 04/14/2021   HGBA1C 5.3  04/14/2021       Assessment & Plan:   Problem List Items Addressed This Visit     BMI 40.0-44.9, adult (HCC)    Discussed diet and exercise.        Hypertension    Blood pressure as outlined.  Remains elevated above goal.  Start hctz 12.5mg  q day.  Follow pressures. Follow metabolic panel.  On yasmin.  Follow.       Relevant Medications   hydrochlorothiazide (MICROZIDE) 12.5 MG capsule   Other Relevant Orders   Basic metabolic panel   Peripheral edema    No significant swelling now.  Follow.         Dale , MD

## 2021-08-06 ENCOUNTER — Encounter: Payer: Self-pay | Admitting: Internal Medicine

## 2021-08-06 DIAGNOSIS — I10 Essential (primary) hypertension: Secondary | ICD-10-CM | POA: Insufficient documentation

## 2021-08-06 NOTE — Assessment & Plan Note (Signed)
Blood pressure as outlined.  Remains elevated above goal.  Start hctz 12.5mg  q day.  Follow pressures. Follow metabolic panel.  On yasmin.  Follow.

## 2021-08-06 NOTE — Assessment & Plan Note (Signed)
No significant swelling now.  Follow.

## 2021-08-06 NOTE — Assessment & Plan Note (Signed)
Discussed diet and exercise 

## 2021-08-11 ENCOUNTER — Other Ambulatory Visit (INDEPENDENT_AMBULATORY_CARE_PROVIDER_SITE_OTHER): Payer: Managed Care, Other (non HMO)

## 2021-08-11 ENCOUNTER — Other Ambulatory Visit: Payer: Self-pay

## 2021-08-11 DIAGNOSIS — I10 Essential (primary) hypertension: Secondary | ICD-10-CM

## 2021-08-11 LAB — BASIC METABOLIC PANEL
BUN: 10 mg/dL (ref 6–23)
CO2: 26 mEq/L (ref 19–32)
Calcium: 9.4 mg/dL (ref 8.4–10.5)
Chloride: 102 mEq/L (ref 96–112)
Creatinine, Ser: 0.82 mg/dL (ref 0.40–1.20)
GFR: 93.98 mL/min (ref >60.00)
Glucose, Bld: 86 mg/dL (ref 70–99)
Potassium: 4.7 mEq/L (ref 3.5–5.1)
Sodium: 136 mEq/L (ref 135–145)

## 2021-08-19 ENCOUNTER — Other Ambulatory Visit: Payer: Self-pay | Admitting: Internal Medicine

## 2021-08-25 ENCOUNTER — Ambulatory Visit: Payer: Managed Care, Other (non HMO) | Admitting: Adult Health

## 2021-09-06 ENCOUNTER — Other Ambulatory Visit: Payer: Self-pay

## 2021-09-06 ENCOUNTER — Ambulatory Visit (INDEPENDENT_AMBULATORY_CARE_PROVIDER_SITE_OTHER): Payer: Managed Care, Other (non HMO) | Admitting: Internal Medicine

## 2021-09-06 DIAGNOSIS — R609 Edema, unspecified: Secondary | ICD-10-CM

## 2021-09-06 DIAGNOSIS — M25473 Effusion, unspecified ankle: Secondary | ICD-10-CM | POA: Diagnosis not present

## 2021-09-06 DIAGNOSIS — I10 Essential (primary) hypertension: Secondary | ICD-10-CM

## 2021-09-06 NOTE — Progress Notes (Signed)
[name], female   DOB: 1986-01-28, 35 y.o.   MRN: [id number]   Subjective:    [name], female    DOB: Oct 01, 1986, 35 y.o.   MRN: [id number]  This visit occurred during the SARS-CoV-2 public health emergency.  Safety protocols were in place, including screening questions prior to the visit, additional usage of staff PPE, and extensive cleaning of exam room while observing appropriate contact time as indicated for disinfecting solutions.   Patient here for scheduled follow up.   Chief Complaint  Patient presents with   Hypertension   .   Hypertension Pertinent negatives include no chest pain, headaches, palpitations or shortness of breath.  Here to follow up regarding her blood pressure.  Started on hctz last visit.  Blood pressure doing better.  No headache. No dizziness.  No chest pain or sob reported.  No abdominal pain.  Periods regular.     Past Medical History:  Diagnosis Date   Allergy    Past Surgical History:  Procedure Laterality Date   WRIST SURGERY Left    2020   Family History  Problem Relation Age of Onset   Hypertension Mother    Breast cancer Mother 18   Skin cancer Mother    Diabetes Mellitus II Mother    Atrial fibrillation Mother    Asthma Father    Hypertension Maternal Aunt    Breast cancer Maternal Aunt 31   Ulcerative colitis Paternal Aunt    Diabetes Mellitus II Paternal Aunt    Cataracts Paternal Aunt    Anemia Paternal Aunt    Parkinson's disease Paternal Aunt    Hypertension Paternal Aunt    Hyperlipidemia Paternal Aunt    Fibroids Paternal Aunt    Other Paternal Aunt    Heart disease Maternal Grandmother    Stroke Maternal Grandmother    Esophageal cancer Maternal Grandfather    Liver cancer Maternal Grandfather    Hyperlipidemia Maternal Grandfather    Hypertension Maternal Grandfather    Parkinson's disease Paternal Grandmother    Anemia Paternal Grandmother    Hypotension Paternal Grandmother     Cataracts Paternal Grandmother    Breast cancer Paternal Grandfather    Diabetes Mellitus II Paternal Grandfather    Heart disease Paternal Grandfather    Liver cancer Paternal Grandfather    Social History   Socioeconomic History   Marital status: Single    Spouse name: Not on file   Number of children: Not on file   Years of education: Not on file   Highest education level: Not on file  Occupational History   Not on file  Tobacco Use   Smoking status: Never   Smokeless tobacco: Never  Substance and Sexual Activity   Alcohol use: Yes    Alcohol/week: 1.0 standard drink    Types: 1 Glasses of wine per week   Drug use: Never   Sexual activity: Not Currently  Other Topics Concern   Not on file  Social History Narrative   Not on file   Social Determinants of Health   Financial Resource Strain: Not on file  Food Insecurity: Not on file  Transportation Needs: Not on file  Physical Activity: Not on file  Stress: Not on file  Social Connections: Not on file     Review of Systems  Constitutional:  Negative for appetite change and unexpected weight change.  HENT:  Negative for congestion and sinus pressure.   Respiratory:  Negative for cough, chest tightness  and shortness of breath.   Cardiovascular:  Negative for chest pain, palpitations and leg swelling.  Gastrointestinal:  Negative for abdominal pain, diarrhea, nausea and vomiting.  Genitourinary:  Negative for difficulty urinating and dysuria.  Musculoskeletal:  Negative for joint swelling and myalgias.  Skin:  Negative for color change and rash.  Neurological:  Negative for dizziness, light-headedness and headaches.  Psychiatric/Behavioral:  Negative for agitation and dysphoric mood.       Objective:     BP 116/78   Pulse 81   Temp 98 F (36.7 C)   Resp 16   Ht 5\' 3"  (1.6 m)   Wt 240 lb 9.6 oz (109.1 kg)   SpO2 99%   BMI 42.62 kg/m  Wt Readings from Last 3 Encounters:  09/06/21 240 lb 9.6 oz (109.1 kg)   07/28/21 245 lb 12.8 oz (111.5 kg)  05/23/21 240 lb 9.6 oz (109.1 kg)    Physical Exam Vitals reviewed.  Constitutional:      General: She is not in acute distress.    Appearance: Normal appearance.  HENT:     Head: Normocephalic and atraumatic.     Right Ear: External ear normal.     Left Ear: External ear normal.  Eyes:     General: No scleral icterus.       Right eye: No discharge.        Left eye: No discharge.     Conjunctiva/sclera: Conjunctivae normal.  Neck:     Thyroid: No thyromegaly.  Cardiovascular:     Rate and Rhythm: Normal rate and regular rhythm.  Pulmonary:     Effort: No respiratory distress.     Breath sounds: Normal breath sounds. No wheezing.  Abdominal:     General: Bowel sounds are normal.     Palpations: Abdomen is soft.     Tenderness: There is no abdominal tenderness.  Musculoskeletal:        General: No swelling or tenderness.     Cervical back: Neck supple. No tenderness.  Lymphadenopathy:     Cervical: No cervical adenopathy.  Skin:    Findings: No erythema or rash.  Neurological:     Mental Status: She is alert.  Psychiatric:        Mood and Affect: Mood normal.        Behavior: Behavior normal.     Outpatient Encounter Medications as of 09/06/2021  Medication Sig   drospirenone-ethinyl estradiol (YASMIN) 3-0.03 MG tablet Take 1 tablet by mouth daily.   fluticasone (FLONASE) 50 MCG/ACT nasal spray Place 2 sprays into both nostrils daily.   loratadine (CLARITIN) 10 MG tablet Take 10 mg by mouth daily.   [DISCONTINUED] hydrochlorothiazide (MICROZIDE) 12.5 MG capsule TAKE 1 CAPSULE BY MOUTH EVERY DAY   No facility-administered encounter medications on file as of 09/06/2021.     Lab Results  Component Value Date   WBC 11.2 (H) 04/14/2021   HGB 12.9 04/14/2021   HCT 39.1 04/14/2021   PLT 307.0 04/14/2021   GLUCOSE 86 08/11/2021   CHOL 166 04/14/2021   TRIG 78.0 04/14/2021   HDL 71.10 04/14/2021   LDLCALC 79 04/14/2021   ALT  10 04/14/2021   AST 12 04/14/2021   NA 136 08/11/2021   K 4.7 08/11/2021   CL 102 08/11/2021   CREATININE 0.82 08/11/2021   BUN 10 08/11/2021   CO2 26 08/11/2021   TSH 3.65 04/14/2021   HGBA1C 5.3 04/14/2021       Assessment & Plan:  Problem List Items Addressed This Visit     Ankle edema    Previously noted intermittent swelling.  Is watching diet.  Have discussed diet and exercise.  No increased swelling currently.  On hctz and tolerating.  Follow metabolic panel.       Hypertension    Blood pressure doing well on hctz.  Tolerating.  Follow pressures.  Follow metabolic panel.        Peripheral edema    No significant swelling.  Follow.  Pressures better.         Dale Elberon, MD

## 2021-09-12 ENCOUNTER — Other Ambulatory Visit: Payer: Self-pay | Admitting: Internal Medicine

## 2021-09-18 ENCOUNTER — Encounter: Payer: Self-pay | Admitting: Internal Medicine

## 2021-09-18 NOTE — Assessment & Plan Note (Signed)
No significant swelling.  Follow.  Pressures better.

## 2021-09-18 NOTE — Assessment & Plan Note (Signed)
Previously noted intermittent swelling.  Is watching diet.  Have discussed diet and exercise.  No increased swelling currently.  On hctz and tolerating.  Follow metabolic panel.

## 2021-09-18 NOTE — Assessment & Plan Note (Signed)
Blood pressure doing well on hctz.  Tolerating.  Follow pressures.  Follow metabolic panel.   

## 2021-09-20 ENCOUNTER — Other Ambulatory Visit: Payer: Self-pay | Admitting: Family

## 2021-09-20 MED ORDER — DROSPIRENONE-ETHINYL ESTRADIOL 3-0.03 MG PO TABS: 1.0000 | ORAL_TABLET | Freq: Every day | ORAL | 0 refills | Status: DC

## 2021-10-05 ENCOUNTER — Other Ambulatory Visit: Payer: Self-pay | Admitting: Internal Medicine

## 2021-12-08 ENCOUNTER — Ambulatory Visit (INDEPENDENT_AMBULATORY_CARE_PROVIDER_SITE_OTHER): Payer: Managed Care, Other (non HMO) | Admitting: Internal Medicine

## 2021-12-08 ENCOUNTER — Other Ambulatory Visit: Payer: Self-pay

## 2021-12-08 VITALS — BP 128/78 | HR 90 | Temp 97.6°F | Resp 16 | Ht 63.0 in | Wt 244.0 lb

## 2021-12-08 DIAGNOSIS — Z6841 Body Mass Index (BMI) 40.0 and over, adult: Secondary | ICD-10-CM | POA: Diagnosis not present

## 2021-12-08 DIAGNOSIS — I10 Essential (primary) hypertension: Secondary | ICD-10-CM

## 2021-12-08 DIAGNOSIS — D72829 Elevated white blood cell count, unspecified: Secondary | ICD-10-CM | POA: Diagnosis not present

## 2021-12-08 DIAGNOSIS — M25473 Effusion, unspecified ankle: Secondary | ICD-10-CM | POA: Diagnosis not present

## 2021-12-08 LAB — CBC WITH DIFFERENTIAL/PLATELET
Basophils Absolute: 0.1 10*3/uL (ref 0.0–0.1)
Basophils Relative: 0.8 % (ref 0.0–3.0)
Eosinophils Absolute: 0.3 10*3/uL (ref 0.0–0.7)
Eosinophils Relative: 3.2 % (ref 0.0–5.0)
HCT: 41.7 % (ref 36.0–46.0)
Hemoglobin: 13.6 g/dL (ref 12.0–15.0)
Lymphocytes Relative: 29.2 % (ref 12.0–46.0)
Lymphs Abs: 2.7 10*3/uL (ref 0.7–4.0)
MCHC: 32.7 g/dL (ref 30.0–36.0)
MCV: 86.7 fl (ref 78.0–100.0)
Monocytes Absolute: 0.5 10*3/uL (ref 0.1–1.0)
Monocytes Relative: 5.4 % (ref 3.0–12.0)
Neutro Abs: 5.6 10*3/uL (ref 1.4–7.7)
Neutrophils Relative %: 61.4 % (ref 43.0–77.0)
Platelets: 381 10*3/uL (ref 150.0–400.0)
RBC: 4.81 Mil/uL (ref 3.87–5.11)
RDW: 13.5 % (ref 11.5–15.5)
WBC: 9.2 10*3/uL (ref 4.0–10.5)

## 2021-12-08 LAB — BASIC METABOLIC PANEL
BUN: 10 mg/dL (ref 6–23)
CO2: 27 mEq/L (ref 19–32)
Calcium: 9.3 mg/dL (ref 8.4–10.5)
Chloride: 104 mEq/L (ref 96–112)
Creatinine, Ser: 0.74 mg/dL (ref 0.40–1.20)
GFR: 106.05 mL/min (ref >60.00)
Glucose, Bld: 92 mg/dL (ref 70–99)
Potassium: 4.2 mEq/L (ref 3.5–5.1)
Sodium: 138 mEq/L (ref 135–145)

## 2021-12-08 NOTE — Progress Notes (Signed)
[name], female   DOB: 26-May-1986, 36 y.o.   MRN: [id number]   Subjective:    [name], female    DOB: [birth year], 36 y.o.   MRN: [id number]  This visit occurred during the SARS-CoV-2 public health emergency.  Safety protocols were in place, including screening questions prior to the visit, additional usage of staff PPE, and extensive cleaning of exam room while observing appropriate contact time as indicated for disinfecting solutions.   Patient here for a scheduled follow up.   Chief Complaint  Patient presents with   Hypertension   .   HPI Here to follow up regarding her blood pressure.  On hctz.  Tolerating and doing well.  Still with increased stress.  Discussed.  Overall feels she is handling things relatively well.  No chest pain or sob reported.  No abdominal pain or bowel change reported.     Past Medical History:  Diagnosis Date   Allergy    Past Surgical History:  Procedure Laterality Date   WRIST SURGERY Left    2020   Family History  Problem Relation Age of Onset   Hypertension Mother    Breast cancer Mother 6   Skin cancer Mother    Diabetes Mellitus II Mother    Atrial fibrillation Mother    Asthma Father    Hypertension Maternal Aunt    Breast cancer Maternal Aunt 64   Ulcerative colitis Paternal Aunt    Diabetes Mellitus II Paternal Aunt    Cataracts Paternal Aunt    Anemia Paternal Aunt    Parkinson's disease Paternal Aunt    Hypertension Paternal Aunt    Hyperlipidemia Paternal Aunt    Fibroids Paternal Aunt    Other Paternal Aunt    Heart disease Maternal Grandmother    Stroke Maternal Grandmother    Esophageal cancer Maternal Grandfather    Liver cancer Maternal Grandfather    Hyperlipidemia Maternal Grandfather    Hypertension Maternal Grandfather    Parkinson's disease Paternal Grandmother    Anemia Paternal Grandmother    Hypotension Paternal Grandmother    Cataracts Paternal Grandmother    Breast cancer  Paternal Grandfather    Diabetes Mellitus II Paternal Grandfather    Heart disease Paternal Grandfather    Liver cancer Paternal Grandfather    Social History   Socioeconomic History   Marital status: Single    Spouse name: Not on file   Number of children: Not on file   Years of education: Not on file   Highest education level: Not on file  Occupational History   Not on file  Tobacco Use   Smoking status: Never   Smokeless tobacco: Never  Substance and Sexual Activity   Alcohol use: Yes    Alcohol/week: 1.0 standard drink    Types: 1 Glasses of wine per week   Drug use: Never   Sexual activity: Not Currently  Other Topics Concern   Not on file  Social History Narrative   Not on file   Social Determinants of Health   Financial Resource Strain: Not on file  Food Insecurity: Not on file  Transportation Needs: Not on file  Physical Activity: Not on file  Stress: Not on file  Social Connections: Not on file     Review of Systems  Constitutional:  Negative for appetite change and unexpected weight change.  HENT:  Negative for congestion and sinus pressure.   Respiratory:  Negative for cough, chest tightness and shortness of  breath.   Cardiovascular:  Negative for chest pain, palpitations and leg swelling.  Gastrointestinal:  Negative for abdominal pain, diarrhea, nausea and vomiting.  Genitourinary:  Negative for difficulty urinating and dysuria.  Musculoskeletal:  Negative for joint swelling and myalgias.  Skin:  Negative for color change and rash.  Neurological:  Negative for dizziness, light-headedness and headaches.  Psychiatric/Behavioral:  Negative for agitation and dysphoric mood.       Objective:     BP 128/78    Pulse 90    Temp 97.6 F (36.4 C)    Resp 16    Ht 5\' 3"  (1.6 m)    Wt 244 lb (110.7 kg)    SpO2 97%    BMI 43.22 kg/m  Wt Readings from Last 3 Encounters:  12/08/21 244 lb (110.7 kg)  09/06/21 240 lb 9.6 oz (109.1 kg)  07/28/21 245 lb 12.8 oz  (111.5 kg)    Physical Exam Vitals reviewed.  Constitutional:      General: She is not in acute distress.    Appearance: Normal appearance.  HENT:     Head: Normocephalic and atraumatic.     Right Ear: External ear normal.     Left Ear: External ear normal.  Eyes:     General: No scleral icterus.       Right eye: No discharge.        Left eye: No discharge.     Conjunctiva/sclera: Conjunctivae normal.  Neck:     Thyroid: No thyromegaly.  Cardiovascular:     Rate and Rhythm: Normal rate and regular rhythm.  Pulmonary:     Effort: No respiratory distress.     Breath sounds: Normal breath sounds. No wheezing.  Abdominal:     General: Bowel sounds are normal.     Palpations: Abdomen is soft.     Tenderness: There is no abdominal tenderness.  Musculoskeletal:        General: No swelling or tenderness.     Cervical back: Neck supple. No tenderness.  Lymphadenopathy:     Cervical: No cervical adenopathy.  Skin:    Findings: No erythema or rash.  Neurological:     Mental Status: She is alert.  Psychiatric:        Mood and Affect: Mood normal.        Behavior: Behavior normal.     Outpatient Encounter Medications as of 12/08/2021  Medication Sig   drospirenone-ethinyl estradiol (YASMIN) 3-0.03 MG tablet Take 1 tablet by mouth daily.   fluticasone (FLONASE) 50 MCG/ACT nasal spray Place 2 sprays into both nostrils daily.   hydrochlorothiazide (MICROZIDE) 12.5 MG capsule TAKE 1 CAPSULE BY MOUTH EVERY DAY   loratadine (CLARITIN) 10 MG tablet Take 10 mg by mouth daily.   No facility-administered encounter medications on file as of 12/08/2021.     Lab Results  Component Value Date   WBC 9.2 12/08/2021   HGB 13.6 12/08/2021   HCT 41.7 12/08/2021   PLT 381.0 12/08/2021   GLUCOSE 92 12/08/2021   CHOL 166 04/14/2021   TRIG 78.0 04/14/2021   HDL 71.10 04/14/2021   LDLCALC 79 04/14/2021   ALT 10 04/14/2021   AST 12 04/14/2021   NA 138 12/08/2021   K 4.2 12/08/2021   CL  104 12/08/2021   CREATININE 0.74 12/08/2021   BUN 10 12/08/2021   CO2 27 12/08/2021   TSH 3.65 04/14/2021   HGBA1C 5.3 04/14/2021       Assessment & Plan:   Problem  List Items Addressed This Visit     Ankle edema    Not an issue now.  Follow.        BMI 40.0-44.9, adult ([place])    Have discussed diet and exercise.        Hypertension - Primary    Blood pressure doing well on hctz.  Tolerating.  Follow pressures.  Follow metabolic panel.        Relevant Orders   Basic metabolic panel (Completed)   Leukocytosis    Recheck cbc to confirm wnl.       Relevant Orders   CBC with Differential/Platelet (Completed)     Einar Pheasant, MD

## 2021-12-16 ENCOUNTER — Telehealth: Payer: Self-pay | Admitting: Family

## 2021-12-18 ENCOUNTER — Encounter: Payer: Self-pay | Admitting: Internal Medicine

## 2021-12-18 NOTE — Assessment & Plan Note (Signed)
Have discussed diet and exercise.   

## 2021-12-18 NOTE — Assessment & Plan Note (Signed)
Recheck cbc to confirm wnl.  

## 2021-12-18 NOTE — Assessment & Plan Note (Signed)
Blood pressure doing well on hctz.  Tolerating.  Follow pressures.  Follow metabolic panel.   

## 2021-12-18 NOTE — Assessment & Plan Note (Signed)
Not an issue now.  Follow.    

## 2021-12-29 NOTE — Telephone Encounter (Signed)
Pt called in requesting Refill on medication (drospirenone-ethinyl estradiol (YASMIN) 3-0.03 MG tablet). Pt stated that she only have a week left of birth control. Pt stated if the medication can be refill until her next appt that is upcoming up on 04/11/2022 at 4pm. Pt requesting callback

## 2021-12-30 ENCOUNTER — Other Ambulatory Visit: Payer: Self-pay | Admitting: Adult Health

## 2021-12-30 MED ORDER — DROSPIRENONE-ETHINYL ESTRADIOL 3-0.03 MG PO TABS: 1.0000 | ORAL_TABLET | Freq: Every day | ORAL | 0 refills | Status: DC

## 2022-01-09 ENCOUNTER — Telehealth: Payer: Self-pay | Admitting: Adult Health

## 2022-01-09 NOTE — Telephone Encounter (Signed)
Pt want to know if she can be taken on as a new patient by [facility]. Pt said that [facility] would

## 2022-01-11 NOTE — Telephone Encounter (Signed)
Schedule new pt appt with Dr Lorin Picket in June AFTER 6/2 ?

## 2022-01-11 NOTE — Telephone Encounter (Signed)
Yes

## 2022-03-15 ENCOUNTER — Other Ambulatory Visit: Payer: Self-pay

## 2022-03-15 ENCOUNTER — Telehealth: Payer: Self-pay | Admitting: Adult Health

## 2022-03-15 MED ORDER — DROSPIRENONE-ETHINYL ESTRADIOL 3-0.03 MG PO TABS: 1.0000 | ORAL_TABLET | Freq: Every day | ORAL | 0 refills | Status: DC

## 2022-03-15 NOTE — Telephone Encounter (Signed)
Pt need refill on drospirenone-ethinyl estradiol sent to [facility] ?

## 2022-03-15 NOTE — Telephone Encounter (Signed)
Patient called and notified that prescription was sent.  ?

## 2022-04-09 ENCOUNTER — Other Ambulatory Visit: Payer: Self-pay | Admitting: Internal Medicine

## 2022-04-11 ENCOUNTER — Encounter: Payer: Managed Care, Other (non HMO) | Admitting: Adult Health

## 2022-04-17 ENCOUNTER — Encounter: Payer: Managed Care, Other (non HMO) | Admitting: Adult Health

## 2022-04-21 ENCOUNTER — Encounter: Payer: Self-pay | Admitting: Internal Medicine

## 2022-04-21 ENCOUNTER — Ambulatory Visit (INDEPENDENT_AMBULATORY_CARE_PROVIDER_SITE_OTHER): Payer: Managed Care, Other (non HMO) | Admitting: Internal Medicine

## 2022-04-21 VITALS — BP 138/76 | HR 101 | Temp 98.6°F | Resp 17 | Ht 63.0 in | Wt 242.4 lb

## 2022-04-21 DIAGNOSIS — I10 Essential (primary) hypertension: Secondary | ICD-10-CM

## 2022-04-21 DIAGNOSIS — Z13220 Encounter for screening for lipoid disorders: Secondary | ICD-10-CM

## 2022-04-21 DIAGNOSIS — M25473 Effusion, unspecified ankle: Secondary | ICD-10-CM | POA: Diagnosis not present

## 2022-04-21 DIAGNOSIS — Z803 Family history of malignant neoplasm of breast: Secondary | ICD-10-CM

## 2022-04-21 DIAGNOSIS — D72829 Elevated white blood cell count, unspecified: Secondary | ICD-10-CM

## 2022-04-21 NOTE — Progress Notes (Signed)
[name], female   DOB: [birth year], 36 y.o.   MRN: [id number]   Subjective:    [name], female    DOB: 03-19-1986, 36 y.o.   MRN: [id number]   Patient here to establish care.   Chief Complaint  Patient presents with   Establish Care   .   HPI Here to establish care. Previously seeing [facility].  History of hypertension.  In reviewing, mother with breast cancer, grandfather - breast cancer.  On hctz for her blood pressure.  Has done well on this medication.  Increased stress.  Have discussed.  Overall she feels she is handling things relatively well.  Some increased anxiety.  No chest pain or sob reported.  No abdominal pain.  Bowels moving.  Discussed diet and exercise.  [place]  [place] OCPs.  Regulates her period.  Periods are lighter.  No increased swelling.    Past Medical History:  Diagnosis Date   Allergy    Hypertension    Past Surgical History:  Procedure Laterality Date   FOOT SURGERY Right    removal of broken needle from foot 2000   WRIST SURGERY Left    Broken wrist repair 2020   Family History  Problem Relation Age of Onset   Hypertension Mother        PATWPW   Breast cancer Mother 52   Skin cancer Mother    Diabetes Mellitus II Mother    Atrial fibrillation Mother    Hyperlipidemia Mother    Asthma Father        PreDiabetes   Heart disease Maternal Grandmother    Stroke Maternal Grandmother    Esophageal cancer Maternal Grandfather    Liver cancer Maternal Grandfather    Hyperlipidemia Maternal Grandfather    Hypertension Maternal Grandfather    Parkinson's disease Paternal Grandmother    Anemia Paternal Grandmother    Hypotension Paternal Grandmother    Cataracts Paternal Grandmother    Breast cancer Paternal Grandfather    Diabetes Mellitus II Paternal Grandfather    Heart disease Paternal Grandfather    Liver cancer Paternal Grandfather    Hypertension Maternal Aunt    Breast cancer Maternal Aunt 64    Ulcerative colitis Paternal Aunt    Diabetes Mellitus II Paternal Aunt    Cataracts Paternal Aunt    Anemia Paternal Aunt    Parkinson's disease Paternal Aunt    Hypertension Paternal Aunt    Hyperlipidemia Paternal Aunt    Fibroids Paternal Aunt    Other Paternal Aunt    Social History   Socioeconomic History   Marital status: Single    Spouse name: Not on file   Number of children: Not on file   Years of education: Not on file   Highest education level: Not on file  Occupational History   Not on file  Tobacco Use   Smoking status: Never   Smokeless tobacco: Never  Substance and Sexual Activity   Alcohol use: Yes    Alcohol/week: 1.0 standard drink of alcohol    Types: 1 Glasses of wine per week   Drug use: Never   Sexual activity: Not Currently  Other Topics Concern   Not on file  Social History Narrative   Not on file   Social Determinants of Health   Financial Resource Strain: Not on file  Food Insecurity: Not on file  Transportation Needs: Not on file  Physical Activity: Not on file  Stress: Not on  file  Social Connections: Not on file     Review of Systems  Constitutional:  Negative for appetite change and unexpected weight change.  HENT:  Negative for congestion and sinus pressure.   Respiratory:  Negative for cough, chest tightness and shortness of breath.   Cardiovascular:  Negative for chest pain, palpitations and leg swelling.  Gastrointestinal:  Negative for abdominal pain, diarrhea, nausea and vomiting.  Genitourinary:  Negative for difficulty urinating and dysuria.  Musculoskeletal:  Negative for joint swelling and myalgias.  Skin:  Negative for color change and rash.  Neurological:  Negative for dizziness, light-headedness and headaches.  Psychiatric/Behavioral:  Negative for agitation and dysphoric mood.        Increased stress as outlined.        Objective:     BP 138/76 (BP Location: Left Arm, Patient Position: Sitting, Cuff Size:  Large)   Pulse (!) 101   Temp 98.6 F (37 C) (Temporal)   Resp 17   Ht 5\' 3"  (1.6 m)   Wt 242 lb 6.4 oz (110 kg)   SpO2 98%   BMI 42.94 kg/m  Wt Readings from Last 3 Encounters:  04/21/22 242 lb 6.4 oz (110 kg)  12/08/21 244 lb (110.7 kg)  09/06/21 240 lb 9.6 oz (109.1 kg)    Physical Exam Vitals reviewed.  Constitutional:      General: She is not in acute distress.    Appearance: Normal appearance.  HENT:     Head: Normocephalic and atraumatic.     Right Ear: External ear normal.     Left Ear: External ear normal.  Eyes:     General: No scleral icterus.       Right eye: No discharge.        Left eye: No discharge.     Conjunctiva/sclera: Conjunctivae normal.  Neck:     Thyroid: No thyromegaly.  Cardiovascular:     Rate and Rhythm: Normal rate and regular rhythm.  Pulmonary:     Effort: No respiratory distress.     Breath sounds: Normal breath sounds. No wheezing.  Abdominal:     General: Bowel sounds are normal.     Palpations: Abdomen is soft.     Tenderness: There is no abdominal tenderness.  Musculoskeletal:        General: No swelling or tenderness.     Cervical back: Neck supple. No tenderness.  Lymphadenopathy:     Cervical: No cervical adenopathy.  Skin:    Findings: No erythema or rash.  Neurological:     Mental Status: She is alert.  Psychiatric:        Mood and Affect: Mood normal.        Behavior: Behavior normal.      Outpatient Encounter Medications as of 04/21/2022  Medication Sig   drospirenone-ethinyl estradiol (YASMIN) 3-0.03 MG tablet Take 1 tablet by mouth daily.   fluticasone (FLONASE) 50 MCG/ACT nasal spray Place 2 sprays into both nostrils daily.   hydrochlorothiazide (MICROZIDE) 12.5 MG capsule TAKE 1 CAPSULE BY MOUTH EVERY DAY   loratadine (CLARITIN) 10 MG tablet Take 10 mg by mouth daily.   No facility-administered encounter medications on file as of 04/21/2022.     Lab Results  Component Value Date   WBC 9.2 12/08/2021   HGB  13.6 12/08/2021   HCT 41.7 12/08/2021   PLT 381.0 12/08/2021   GLUCOSE 92 12/08/2021   CHOL 166 04/14/2021   TRIG 78.0 04/14/2021   HDL 71.10 04/14/2021  LDLCALC 79 04/14/2021   ALT 10 04/14/2021   AST 12 04/14/2021   NA 138 12/08/2021   K 4.2 12/08/2021   CL 104 12/08/2021   CREATININE 0.74 12/08/2021   BUN 10 12/08/2021   CO2 27 12/08/2021   TSH 3.65 04/14/2021   HGBA1C 5.3 04/14/2021       Assessment & Plan:   Problem List Items Addressed This Visit     Ankle edema    Not an issue now.  Follow.        Family history of breast cancer    In reviewing, does have family history of breast cancer.  Consider genetic testing.  Will discuss.       Hypertension    Blood pressure doing well on hctz.  Tolerating.  Follow pressures.  Follow metabolic panel.        Relevant Orders   TSH   Comprehensive metabolic panel   Leukocytosis    WBC count 11/2021 - wnl.        Other Visit Diagnoses     Screening cholesterol level    -  Primary   Relevant Orders   Lipid panel        Dale Union, MD

## 2022-05-01 ENCOUNTER — Encounter: Payer: Self-pay | Admitting: Internal Medicine

## 2022-05-01 NOTE — Assessment & Plan Note (Signed)
Not an issue now.  Follow.    

## 2022-05-01 NOTE — Assessment & Plan Note (Signed)
Blood pressure doing well on hctz.  Tolerating.  Follow pressures.  Follow metabolic panel.

## 2022-05-01 NOTE — Assessment & Plan Note (Signed)
WBC count 11/2021 - wnl.

## 2022-05-01 NOTE — Assessment & Plan Note (Signed)
In reviewing, does have family history of breast cancer.  Consider genetic testing.  Will discuss.

## 2022-06-04 ENCOUNTER — Other Ambulatory Visit: Payer: Self-pay | Admitting: Internal Medicine

## 2022-06-13 ENCOUNTER — Telehealth: Payer: Self-pay | Admitting: Internal Medicine

## 2022-06-20 IMAGING — MG DIGITAL DIAGNOSTIC BILAT W/ TOMO W/ CAD
6 of 10 series · 6 of 30 positions shown · non-contrast
Comparison: None.

CLINICAL DATA: 34-year-old patient recently had a clinical physical
exam, with a mass palpated in the 2-3 o'clock region the right
breast recently. The patient does not palpate the lump today. Her
paternal grandfather, mother, and maternal aunt have a history of
breast cancer. Her mother was diagnosed at age 64.

EXAM:
DIGITAL DIAGNOSTIC BILATERAL MAMMOGRAM WITH CAD AND TOMO
ULTRASOUND RIGHT BREAST

[R MLO synth-2D]
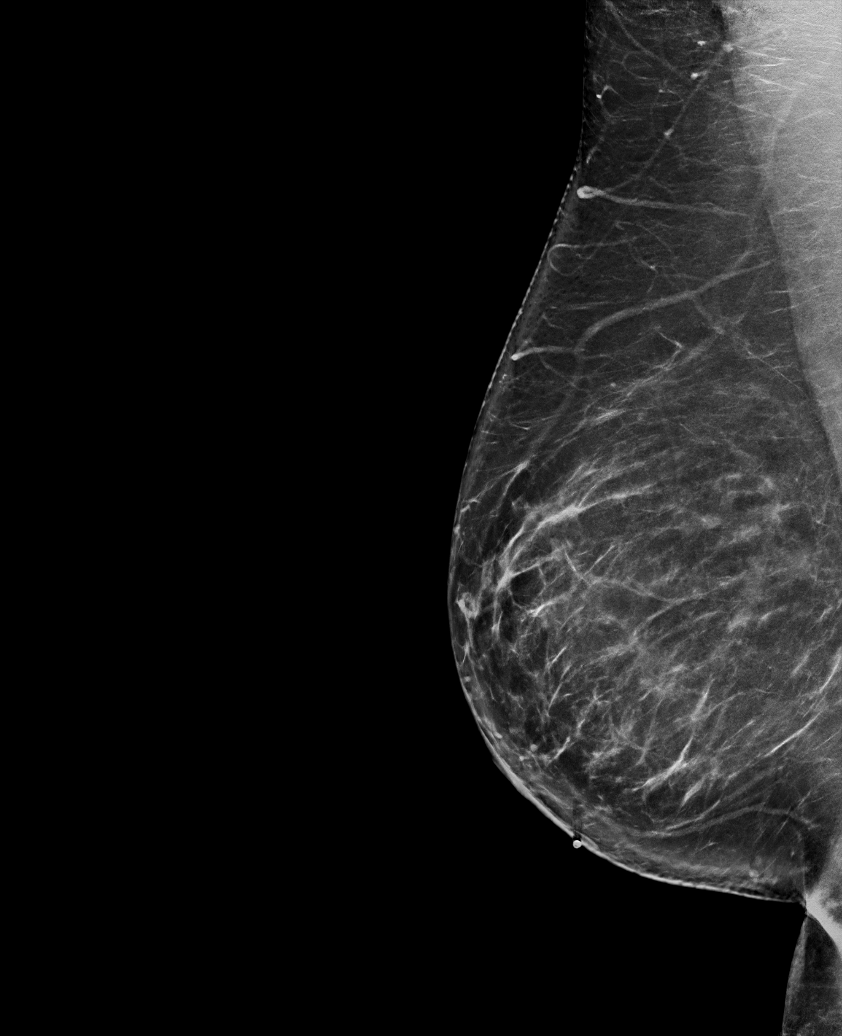

[R TAN synth-2D]
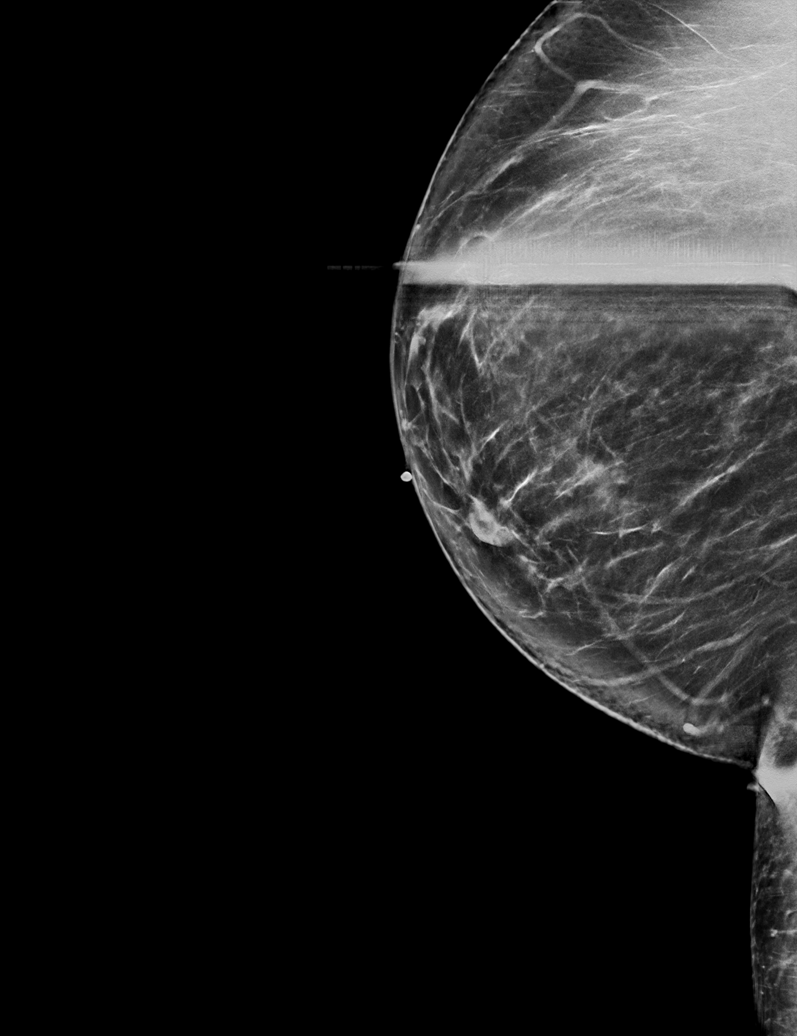

[L CC synth-2D]
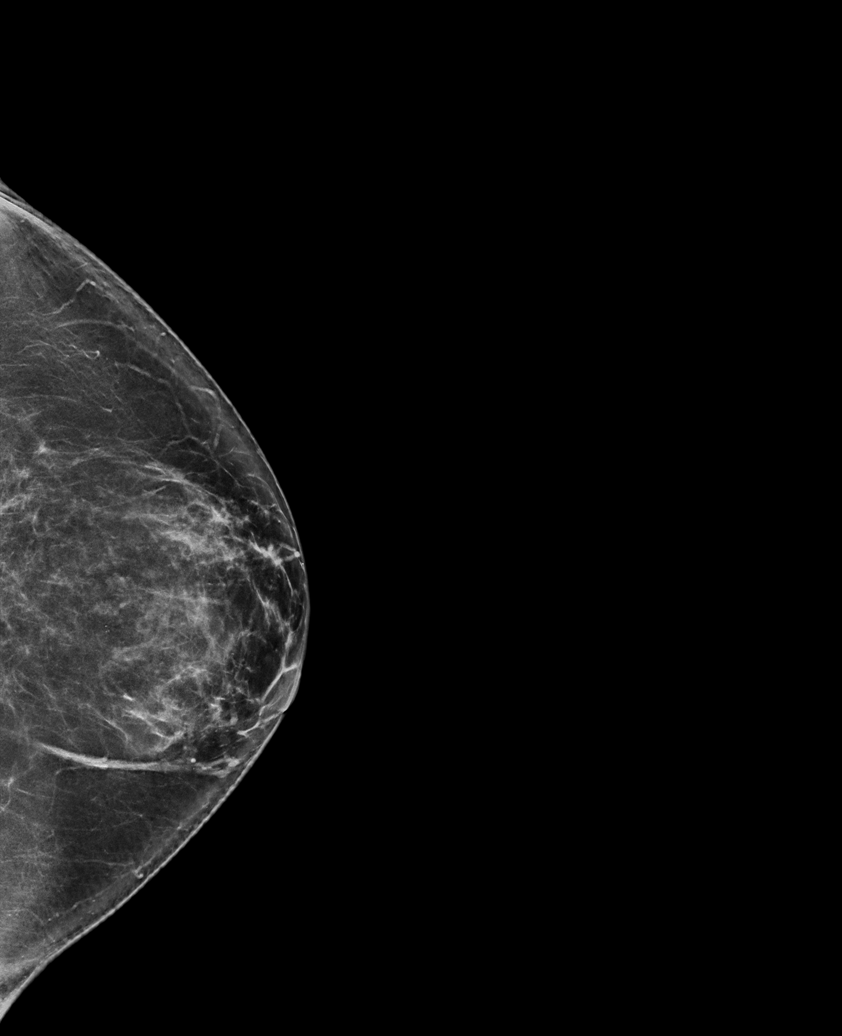

[L MLO synth-2D]
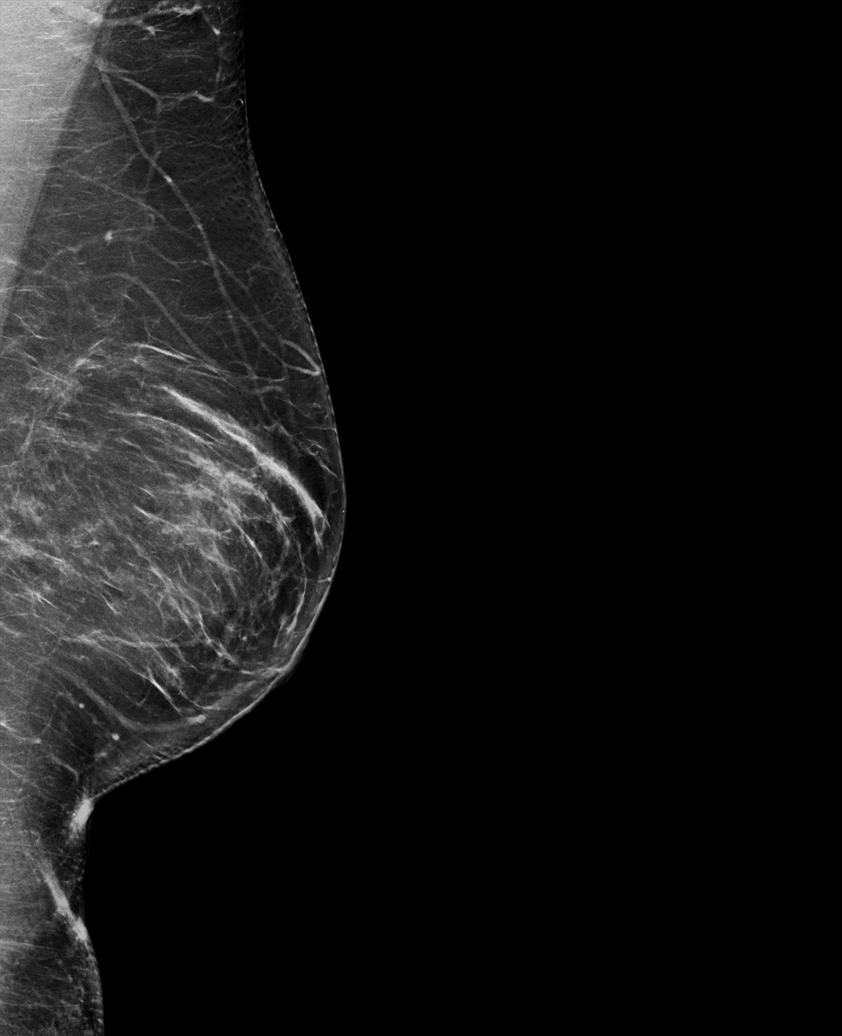

[R CC synth-2D]
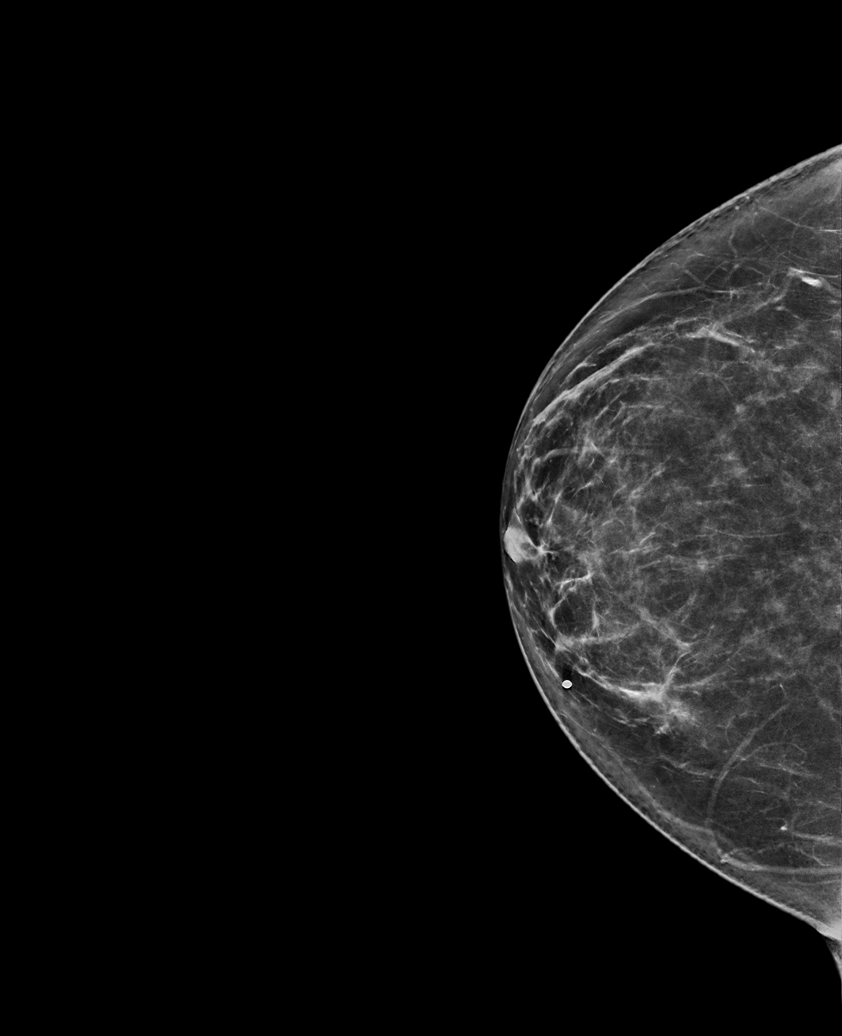

[L MLO tomo · tomo slice 45/88.0]
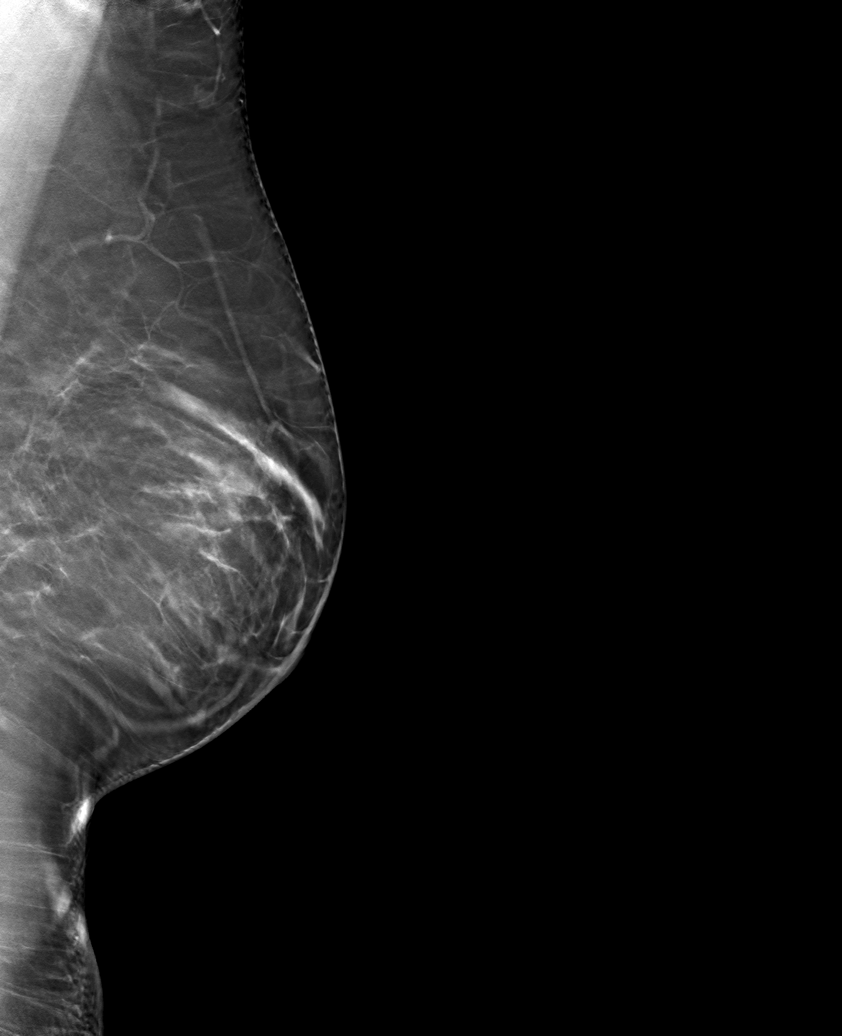

[6 of 30 positions shown; findings below may reference images not displayed]

ACR Breast Density Category b: There are scattered areas of
fibroglandular density.
FINDINGS: No mass, architectural distortion, or suspicious microcalcification
is identified to suggest malignancy in either breast. Spot
tangential view of the region of recent patient concern in the
medial right breast is negative.

Mammographic images were processed with CAD.

Targeted ultrasound is performed, showing normal predominately fatty
breast parenchyma is imaged in the medial right breast in the region
of recent clinical concern. No solid or cystic mass or abnormal
shadowing is identified. Normal skin thickness.
IMPRESSION: No evidence of malignancy in either breast. The patient does not
palpate a discrete lump in either breast today.

RECOMMENDATION:
Screening mammogram at age 42 unless there are persistent or
intervening clinical concerns. (Code:7E-8-H4C)

I have discussed the findings and recommendations with the patient.
If applicable, a reminder letter will be sent to the patient
regarding the next appointment.

BI-RADS CATEGORY  1: Negative.

## 2022-06-20 IMAGING — US US BREAST*R* LIMITED INC AXILLA
1 series · 4 of 4 positions shown · non-contrast
Comparison: None.

CLINICAL DATA: 34-year-old patient recently had a clinical physical
exam, with a mass palpated in the 2-3 o'clock region the right
breast recently. The patient does not palpate the lump today. Her
paternal grandfather, mother, and maternal aunt have a history of
breast cancer. Her mother was diagnosed at age 64.

EXAM:
DIGITAL DIAGNOSTIC BILATERAL MAMMOGRAM WITH CAD AND TOMO
ULTRASOUND RIGHT BREAST

[Series 1: us breast*right* limited inc axilla · 0.09mm/px · 4 of 4 slices shown]
[im 1/4]
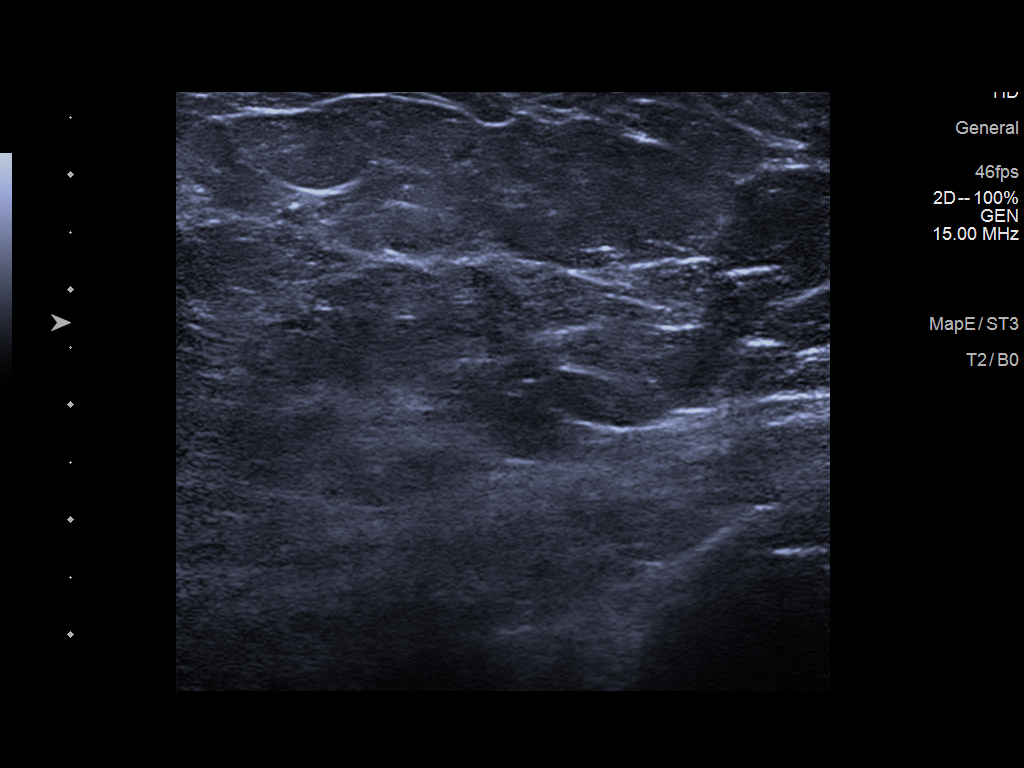
[im 2/4]
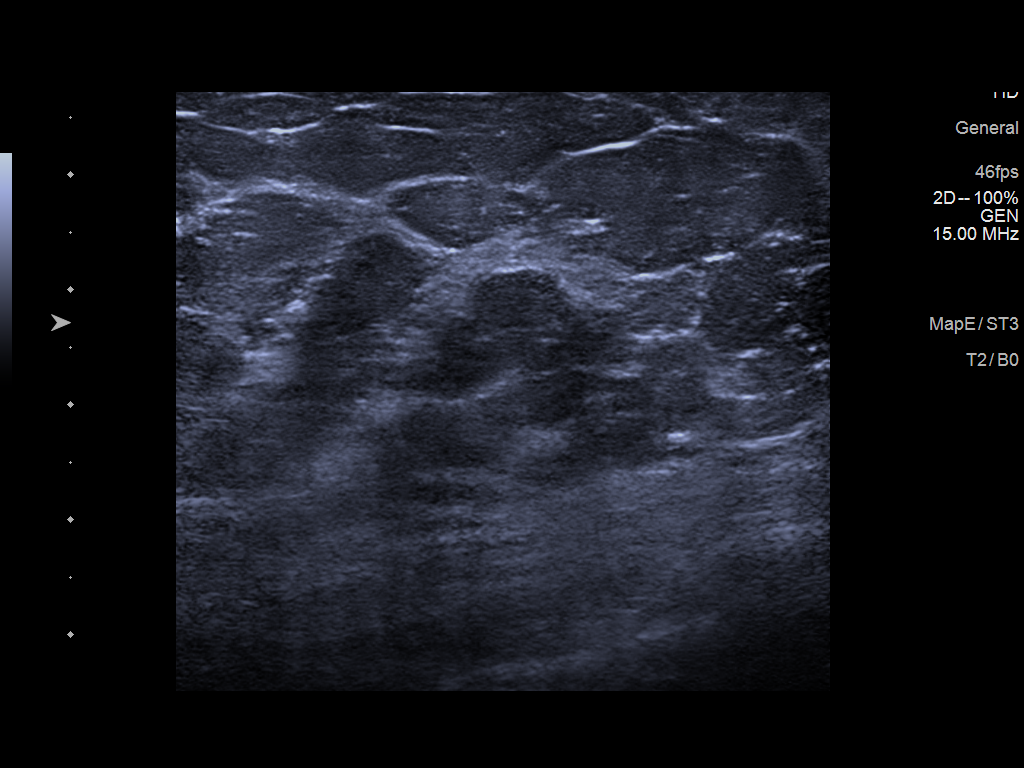
[im 3/4]
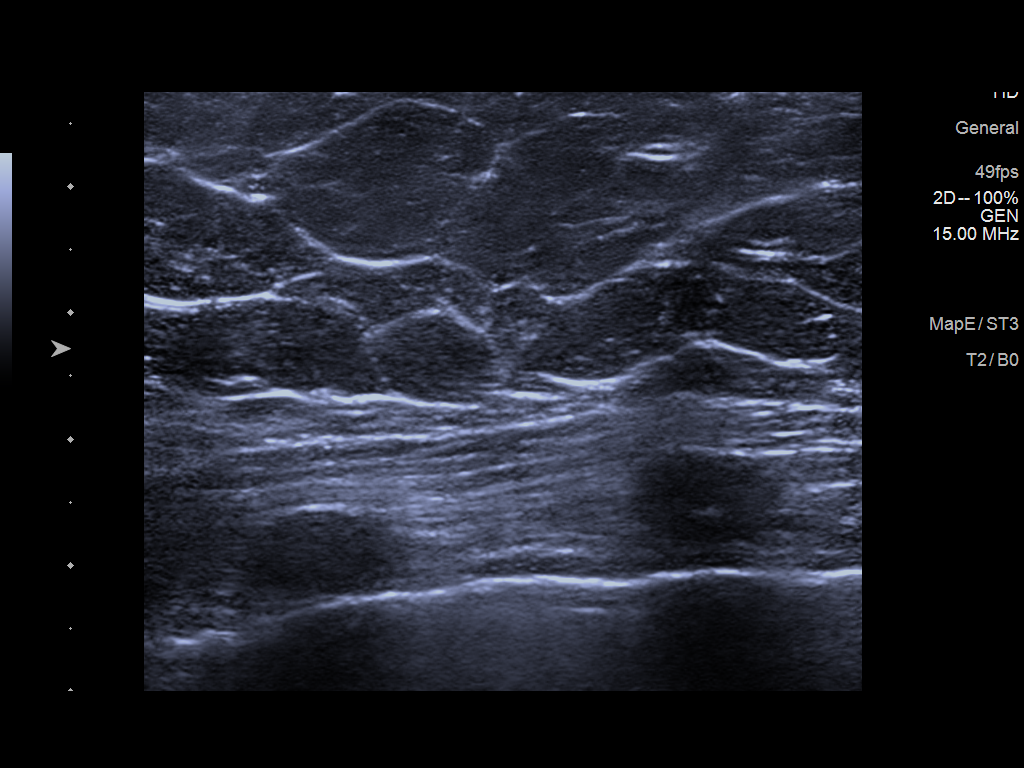
[im 4/4]
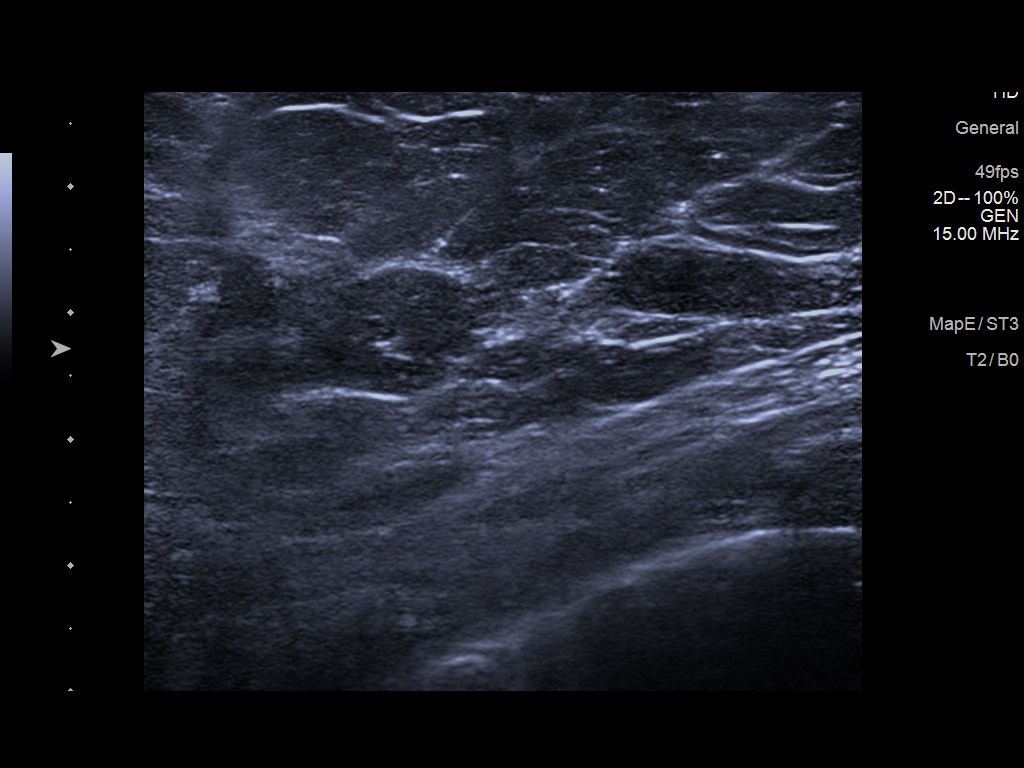

[4 of 4 positions shown; findings below may reference images not displayed]

ACR Breast Density Category b: There are scattered areas of
fibroglandular density.
FINDINGS: No mass, architectural distortion, or suspicious microcalcification
is identified to suggest malignancy in either breast. Spot
tangential view of the region of recent patient concern in the
medial right breast is negative.

Mammographic images were processed with CAD.

Targeted ultrasound is performed, showing normal predominately fatty
breast parenchyma is imaged in the medial right breast in the region
of recent clinical concern. No solid or cystic mass or abnormal
shadowing is identified. Normal skin thickness.
IMPRESSION: No evidence of malignancy in either breast. The patient does not
palpate a discrete lump in either breast today.

RECOMMENDATION:
Screening mammogram at age 42 unless there are persistent or
intervening clinical concerns. (Code:7E-8-H4C)

I have discussed the findings and recommendations with the patient.
If applicable, a reminder letter will be sent to the patient
regarding the next appointment.

BI-RADS CATEGORY  1: Negative.

## 2022-07-11 ENCOUNTER — Encounter: Payer: Self-pay | Admitting: Internal Medicine

## 2022-07-25 ENCOUNTER — Other Ambulatory Visit (INDEPENDENT_AMBULATORY_CARE_PROVIDER_SITE_OTHER): Payer: Managed Care, Other (non HMO)

## 2022-07-25 DIAGNOSIS — I10 Essential (primary) hypertension: Secondary | ICD-10-CM

## 2022-07-25 DIAGNOSIS — Z13220 Encounter for screening for lipoid disorders: Secondary | ICD-10-CM

## 2022-07-25 LAB — LIPID PANEL
Cholesterol: 161 mg/dL (ref 0–200)
HDL: 60.6 mg/dL (ref >39.00)
LDL Cholesterol: 81 mg/dL (ref 0–99)
NonHDL: 100.38
Total CHOL/HDL Ratio: 3
Triglycerides: 97 mg/dL (ref 0.0–149.0)
VLDL: 19.4 mg/dL (ref 0.0–40.0)

## 2022-07-25 LAB — COMPREHENSIVE METABOLIC PANEL
ALT: 9 U/L (ref 0–35)
AST: 11 U/L (ref 0–37)
Albumin: 3.7 g/dL (ref 3.5–5.2)
Alkaline Phosphatase: 85 U/L (ref 39–117)
BUN: 12 mg/dL (ref 6–23)
CO2: 24 mEq/L (ref 19–32)
Calcium: 9.1 mg/dL (ref 8.4–10.5)
Chloride: 103 mEq/L (ref 96–112)
Creatinine, Ser: 0.78 mg/dL (ref 0.40–1.20)
GFR: 99.12 mL/min (ref >60.00)
Glucose, Bld: 94 mg/dL (ref 70–99)
Potassium: 3.9 mEq/L (ref 3.5–5.1)
Sodium: 137 mEq/L (ref 135–145)
Total Bilirubin: 0.2 mg/dL (ref 0.2–1.2)
Total Protein: 7.4 g/dL (ref 6.0–8.3)

## 2022-07-25 LAB — TSH: TSH: 2.71 u[IU]/mL (ref 0.35–5.50)

## 2022-07-28 ENCOUNTER — Ambulatory Visit (INDEPENDENT_AMBULATORY_CARE_PROVIDER_SITE_OTHER): Payer: Managed Care, Other (non HMO) | Admitting: Internal Medicine

## 2022-07-28 ENCOUNTER — Encounter: Payer: Self-pay | Admitting: Internal Medicine

## 2022-07-28 VITALS — BP 124/86 | HR 123 | Temp 98.8°F | Ht 63.0 in | Wt 243.2 lb

## 2022-07-28 DIAGNOSIS — Z0001 Encounter for general adult medical examination with abnormal findings: Secondary | ICD-10-CM | POA: Diagnosis not present

## 2022-07-28 DIAGNOSIS — T300 Burn of unspecified body region, unspecified degree: Secondary | ICD-10-CM

## 2022-07-28 DIAGNOSIS — I10 Essential (primary) hypertension: Secondary | ICD-10-CM | POA: Diagnosis not present

## 2022-07-28 DIAGNOSIS — Z124 Encounter for screening for malignant neoplasm of cervix: Secondary | ICD-10-CM

## 2022-07-28 DIAGNOSIS — Z803 Family history of malignant neoplasm of breast: Secondary | ICD-10-CM

## 2022-07-28 DIAGNOSIS — Z0000 Encounter for general adult medical examination without abnormal findings: Secondary | ICD-10-CM

## 2022-07-28 DIAGNOSIS — T25021A Burn of unspecified degree of right foot, initial encounter: Secondary | ICD-10-CM

## 2022-07-28 MED ORDER — DROSPIRENONE-ETHINYL ESTRADIOL 3-0.03 MG PO TABS: 1.0000 | ORAL_TABLET | Freq: Every day | ORAL | 0 refills | Status: DC

## 2022-07-28 NOTE — Progress Notes (Unsigned)
[name], female   DOB: Oct 20, 1986, 36 y.o.   MRN: [id number]   Subjective:    [name], female    DOB: [birth year], 37 y.o.   MRN: [id number]   Patient here for  Chief Complaint  Patient presents with   Annual Exam    Annual exam & Pap  Burn on top of Right Foot   .   HPI Reports she is doing relatively well. Still with increased stress, but overall appears to be handling things relatively well.  No chest pain or sob reported.  No abdominal pain.  Bowels moving.  Blood pressure elevated initially.  Discussed blood pressures.  Taking hctz.  Period today.  Reports regular.    Past Medical History:  Diagnosis Date   Allergy    Hypertension    Past Surgical History:  Procedure Laterality Date   FOOT SURGERY Right    removal of broken needle from foot 2000   WRIST SURGERY Left    Broken wrist repair 2020   Family History  Problem Relation Age of Onset   Hypertension Mother        PATWPW   Breast cancer Mother 2   Skin cancer Mother    Diabetes Mellitus II Mother    Atrial fibrillation Mother    Hyperlipidemia Mother    Asthma Father        PreDiabetes   Heart disease Maternal Grandmother    Stroke Maternal Grandmother    Esophageal cancer Maternal Grandfather    Liver cancer Maternal Grandfather    Hyperlipidemia Maternal Grandfather    Hypertension Maternal Grandfather    Parkinson's disease Paternal Grandmother    Anemia Paternal Grandmother    Hypotension Paternal Grandmother    Cataracts Paternal Grandmother    Breast cancer Paternal Grandfather    Diabetes Mellitus II Paternal Grandfather    Heart disease Paternal Grandfather    Liver cancer Paternal Grandfather    Hypertension Maternal Aunt    Breast cancer Maternal Aunt 64   Ulcerative colitis Paternal Aunt    Diabetes Mellitus II Paternal Aunt    Cataracts Paternal Aunt    Anemia Paternal Aunt    Parkinson's disease Paternal Aunt    Hypertension Paternal Aunt     Hyperlipidemia Paternal Aunt    Fibroids Paternal Aunt    Other Paternal Aunt    Social History   Socioeconomic History   Marital status: Single    Spouse name: Not on file   Number of children: Not on file   Years of education: Not on file   Highest education level: Not on file  Occupational History   Not on file  Tobacco Use   Smoking status: Never   Smokeless tobacco: Never  Substance and Sexual Activity   Alcohol use: Yes    Alcohol/week: 1.0 standard drink of alcohol    Types: 1 Glasses of wine per week   Drug use: Never   Sexual activity: Not Currently  Other Topics Concern   Not on file  Social History Narrative   Not on file   Social Determinants of Health   Financial Resource Strain: Not on file  Food Insecurity: Not on file  Transportation Needs: Not on file  Physical Activity: Not on file  Stress: Not on file  Social Connections: Not on file     Review of Systems  Constitutional:  Negative for appetite change and unexpected weight change.  HENT:  Negative for congestion, sinus pressure and  sore throat.   Eyes:  Negative for pain and visual disturbance.  Respiratory:  Negative for cough, chest tightness and shortness of breath.   Cardiovascular:  Negative for chest pain, palpitations and leg swelling.  Gastrointestinal:  Negative for abdominal pain, diarrhea, nausea and vomiting.  Genitourinary:  Negative for difficulty urinating and dysuria.  Musculoskeletal:  Negative for joint swelling and myalgias.  Skin:  Negative for color change and rash.  Neurological:  Negative for dizziness, light-headedness and headaches.  Hematological:  Negative for adenopathy. Does not bruise/bleed easily.  Psychiatric/Behavioral:  Negative for agitation and dysphoric mood.        Objective:     BP 124/86   Pulse (!) 123   Temp 98.8 F (37.1 C) (Oral)   Ht 5\' 3"  (1.6 m)   Wt 243 lb 3.2 oz (110.3 kg)   SpO2 99%   BMI 43.08 kg/m  Wt Readings from Last 3  Encounters:  07/28/22 243 lb 3.2 oz (110.3 kg)  04/21/22 242 lb 6.4 oz (110 kg)  12/08/21 244 lb (110.7 kg)    Physical Exam Vitals reviewed.  Constitutional:      General: She is not in acute distress.    Appearance: Normal appearance. She is well-developed.  HENT:     Head: Normocephalic and atraumatic.     Right Ear: External ear normal.     Left Ear: External ear normal.  Eyes:     General: No scleral icterus.       Right eye: No discharge.        Left eye: No discharge.     Conjunctiva/sclera: Conjunctivae normal.  Neck:     Thyroid: No thyromegaly.  Cardiovascular:     Rate and Rhythm: Normal rate and regular rhythm.  Pulmonary:     Effort: No tachypnea, accessory muscle usage or respiratory distress.     Breath sounds: Normal breath sounds. No decreased breath sounds or wheezing.  Chest:  Breasts:    Right: No inverted nipple, mass, nipple discharge or tenderness (no axillary adenopathy).     Left: No inverted nipple, mass, nipple discharge or tenderness (no axilarry adenopathy).  Abdominal:     General: Bowel sounds are normal.     Palpations: Abdomen is soft.     Tenderness: There is no abdominal tenderness.  Genitourinary:    Comments: Normal external genitalia.  Vaginal vault without lesions.  Cervix identified.  Pap smear attempted. Due to discomfort, she asked to stop.  Unable to obtain pap smear.   Could not appreciate any adnexal masses or tenderness.  Blood in vaginal vault. Musculoskeletal:        General: No swelling or tenderness.     Cervical back: Neck supple.  Lymphadenopathy:     Cervical: No cervical adenopathy.  Skin:    Findings: No erythema or rash.  Neurological:     Mental Status: She is alert and oriented to person, place, and time.  Psychiatric:        Mood and Affect: Mood normal.        Behavior: Behavior normal.      Outpatient Encounter Medications as of 07/28/2022  Medication Sig   fluticasone (FLONASE) 50 MCG/ACT nasal spray  Place 2 sprays into both nostrils daily.   hydrochlorothiazide (MICROZIDE) 12.5 MG capsule TAKE 1 CAPSULE BY MOUTH EVERY DAY   loratadine (CLARITIN) 10 MG tablet Take 10 mg by mouth daily.   [DISCONTINUED] SYEDA 3-0.03 MG tablet TAKE 1 TABLET BY MOUTH EVERY DAY  drospirenone-ethinyl estradiol (SYEDA) 3-0.03 MG tablet Take 1 tablet by mouth daily.   No facility-administered encounter medications on file as of 07/28/2022.     Lab Results  Component Value Date   WBC 9.2 12/08/2021   HGB 13.6 12/08/2021   HCT 41.7 12/08/2021   PLT 381.0 12/08/2021   GLUCOSE 94 07/25/2022   CHOL 161 07/25/2022   TRIG 97.0 07/25/2022   HDL 60.60 07/25/2022   LDLCALC 81 07/25/2022   ALT 9 07/25/2022   AST 11 07/25/2022   NA 137 07/25/2022   K 3.9 07/25/2022   CL 103 07/25/2022   CREATININE 0.78 07/25/2022   BUN 12 07/25/2022   CO2 24 07/25/2022   TSH 2.71 07/25/2022   HGBA1C 5.3 04/14/2021       Assessment & Plan:   Problem List Items Addressed This Visit     Burn    Burn on foot.  No open skin.  No evidence of infection.  Follow.       Family history of breast cancer    In reviewing, does have family history of breast cancer.  Consider genetic testing.         Healthcare maintenance    Physical today 07/28/22.  PAP attempted.  Unable to perform due to pain.  Asked for referral to gyn.        Hypertension    Blood pressure doing well on hctz.  Tolerating.  Follow pressures. Have her spot check pressures and send in readings.  Follow metabolic panel.        Other Visit Diagnoses     Routine general medical examination at a health care facility    -  Primary   Cervical cancer screening       Relevant Orders   Ambulatory referral to Gynecology        Dale Coldwater, MD

## 2022-07-30 ENCOUNTER — Encounter: Payer: Self-pay | Admitting: Internal Medicine

## 2022-07-30 DIAGNOSIS — T300 Burn of unspecified body region, unspecified degree: Secondary | ICD-10-CM | POA: Insufficient documentation

## 2022-07-30 NOTE — Assessment & Plan Note (Signed)
Burn on foot.  No open skin.  No evidence of infection.  Follow.

## 2022-07-30 NOTE — Assessment & Plan Note (Signed)
In reviewing, does have family history of breast cancer.  Consider genetic testing.

## 2022-07-30 NOTE — Assessment & Plan Note (Signed)
Blood pressure doing well on hctz.  Tolerating.  Follow pressures. Have her spot check pressures and send in readings.  Follow metabolic panel.   

## 2022-07-30 NOTE — Assessment & Plan Note (Signed)
Physical today 07/28/22.  PAP attempted.  Unable to perform due to pain.  Asked for referral to gyn.

## 2022-07-31 ENCOUNTER — Encounter: Payer: Self-pay | Admitting: Internal Medicine

## 2022-07-31 NOTE — Telephone Encounter (Signed)
Per review of medications, it is showing that the rx was sent to [facility] on 07/28/22.  Where does she need it to go?

## 2022-07-31 NOTE — Telephone Encounter (Signed)
Pt need a refill on SYEDA sent to [facility]

## 2022-07-31 NOTE — Telephone Encounter (Signed)
Spoke with pt and called pts pharmacy the pharmacy stated that they no longer had active insurance info for pt and that's why they could not fill the medication.

## 2022-08-04 NOTE — Telephone Encounter (Signed)
I do not see this form in the chart, will check with the provider.

## 2022-08-04 NOTE — Telephone Encounter (Signed)
Spoke with patient about this. Patient actually found her original form now and will submit the information to her insurance. I did asked patient to submit [place] a copy if she can and I apologized for [place] not been able to locate her form.

## 2022-08-04 NOTE — Telephone Encounter (Signed)
If we do not have a copy or if sent to scan and she is needing now, is there anyway print another copy (or can she get [place] another copy) and will need to complete information again and sign.

## 2022-08-10 ENCOUNTER — Encounter: Payer: Self-pay | Admitting: Internal Medicine

## 2022-08-11 MED ORDER — LORAZEPAM 0.5 MG PO TABS: ORAL_TABLET | ORAL | 0 refills | Status: DC

## 2022-08-11 NOTE — Telephone Encounter (Signed)
Please confirm with her no known drug allergies and no concern regarding pregnancy.  If no, then I can call in Lorazepam - for her to take 1/2 tablet prior to procedure.

## 2022-08-30 LAB — HM PAP SMEAR: HM Pap smear: NORMAL

## 2022-10-05 ENCOUNTER — Other Ambulatory Visit: Payer: Self-pay | Admitting: Internal Medicine

## 2022-11-14 ENCOUNTER — Encounter: Payer: Self-pay | Admitting: Internal Medicine

## 2022-11-28 NOTE — Telephone Encounter (Signed)
Will check [facility] and print list for pt

## 2022-11-29 ENCOUNTER — Ambulatory Visit (INDEPENDENT_AMBULATORY_CARE_PROVIDER_SITE_OTHER): Payer: Managed Care, Other (non HMO) | Admitting: Internal Medicine

## 2022-11-29 VITALS — BP 124/76 | HR 87 | Temp 98.2°F | Ht 63.0 in | Wt 243.0 lb

## 2022-11-29 DIAGNOSIS — I10 Essential (primary) hypertension: Secondary | ICD-10-CM

## 2022-11-29 DIAGNOSIS — Z803 Family history of malignant neoplasm of breast: Secondary | ICD-10-CM | POA: Diagnosis not present

## 2022-11-29 DIAGNOSIS — R609 Edema, unspecified: Secondary | ICD-10-CM | POA: Diagnosis not present

## 2022-11-29 LAB — BASIC METABOLIC PANEL
BUN: 14 mg/dL (ref 6–23)
CO2: 24 mEq/L (ref 19–32)
Calcium: 9.3 mg/dL (ref 8.4–10.5)
Chloride: 105 mEq/L (ref 96–112)
Creatinine, Ser: 0.78 mg/dL (ref 0.40–1.20)
GFR: 98.88 mL/min (ref >60.00)
Glucose, Bld: 93 mg/dL (ref 70–99)
Potassium: 3.9 mEq/L (ref 3.5–5.1)
Sodium: 138 mEq/L (ref 135–145)

## 2022-11-29 LAB — CBC WITH DIFFERENTIAL/PLATELET
Basophils Absolute: 0.1 10*3/uL (ref 0.0–0.1)
Basophils Relative: 0.6 % (ref 0.0–3.0)
Eosinophils Absolute: 0.2 10*3/uL (ref 0.0–0.7)
Eosinophils Relative: 2.4 % (ref 0.0–5.0)
HCT: 41.9 % (ref 36.0–46.0)
Hemoglobin: 13.9 g/dL (ref 12.0–15.0)
Lymphocytes Relative: 28.4 % (ref 12.0–46.0)
Lymphs Abs: 2.8 10*3/uL (ref 0.7–4.0)
MCHC: 33.3 g/dL (ref 30.0–36.0)
MCV: 85.5 fl (ref 78.0–100.0)
Monocytes Absolute: 0.5 10*3/uL (ref 0.1–1.0)
Monocytes Relative: 4.9 % (ref 3.0–12.0)
Neutro Abs: 6.3 10*3/uL (ref 1.4–7.7)
Neutrophils Relative %: 63.7 % (ref 43.0–77.0)
Platelets: 363 10*3/uL (ref 150.0–400.0)
RBC: 4.9 Mil/uL (ref 3.87–5.11)
RDW: 13.9 % (ref 11.5–15.5)
WBC: 9.9 10*3/uL (ref 4.0–10.5)

## 2022-11-29 MED ORDER — HYDROCHLOROTHIAZIDE 12.5 MG PO CAPS: ORAL_CAPSULE | ORAL | 1 refills | Status: DC

## 2022-11-29 MED ORDER — DROSPIRENONE-ETHINYL ESTRADIOL 3-0.03 MG PO TABS: 1.0000 | ORAL_TABLET | Freq: Every day | ORAL | 1 refills | Status: DC

## 2022-11-29 NOTE — Progress Notes (Signed)
Subjective:    [name], female    DOB: 1986/01/19, 37 y.o.   MRN: [id number]  Patient here for No chief complaint on file.   HPI Here to follow up regarding hypertension. Saw gyn 08/30/22 - PAP. (PAP ok with negative HPV).  Per note, recommended f/u pap in 2028.  No chest pain or sob reported.  No abdominal pain or bowel change reported.  No cough or congestion.  Handling stress. Family history of breast cancer - paternal grandfather and maternal aunt. Discussed genetic testing.  Desires to hold.     Past Medical History:  Diagnosis Date   Allergy    Hypertension    Past Surgical History:  Procedure Laterality Date   FOOT SURGERY Right    removal of broken needle from foot 2000   WRIST SURGERY Left    Broken wrist repair 2020   Family History  Problem Relation Age of Onset   Hypertension Mother        PATWPW   Breast cancer Mother 73   Skin cancer Mother    Diabetes Mellitus II Mother    Atrial fibrillation Mother    Hyperlipidemia Mother    Asthma Father        PreDiabetes   Heart disease Maternal Grandmother    Stroke Maternal Grandmother    Esophageal cancer Maternal Grandfather    Liver cancer Maternal Grandfather    Hyperlipidemia Maternal Grandfather    Hypertension Maternal Grandfather    Parkinson's disease Paternal Grandmother    Anemia Paternal Grandmother    Hypotension Paternal Grandmother    Cataracts Paternal Grandmother    Breast cancer Paternal Grandfather    Diabetes Mellitus II Paternal Grandfather    Heart disease Paternal Grandfather    Liver cancer Paternal Grandfather    Hypertension Maternal Aunt    Breast cancer Maternal Aunt 64   Ulcerative colitis Paternal Aunt    Diabetes Mellitus II Paternal Aunt    Cataracts Paternal Aunt    Anemia Paternal Aunt    Parkinson's disease Paternal Aunt    Hypertension Paternal Aunt    Hyperlipidemia Paternal Aunt    Fibroids Paternal Aunt    Other Paternal Aunt    Social History    Socioeconomic History   Marital status: Single    Spouse name: Not on file   Number of children: Not on file   Years of education: Not on file   Highest education level: Not on file  Occupational History   Not on file  Tobacco Use   Smoking status: Never   Smokeless tobacco: Never  Substance and Sexual Activity   Alcohol use: Yes    Alcohol/week: 1.0 standard drink of alcohol    Types: 1 Glasses of wine per week   Drug use: Never   Sexual activity: Not Currently  Other Topics Concern   Not on file  Social History Narrative   Not on file   Social Determinants of Health   Financial Resource Strain: Not on file  Food Insecurity: Not on file  Transportation Needs: Not on file  Physical Activity: Not on file  Stress: Not on file  Social Connections: Not on file     Review of Systems  Constitutional:  Negative for appetite change and unexpected weight change.  HENT:  Negative for congestion and sinus pressure.   Respiratory:  Negative for cough, chest tightness and shortness of breath.   Cardiovascular:  Negative for chest pain, palpitations and leg swelling.  Gastrointestinal:  Negative for abdominal pain, diarrhea, nausea and vomiting.  Genitourinary:  Negative for difficulty urinating and dysuria.  Musculoskeletal:  Negative for joint swelling and myalgias.  Skin:  Negative for color change and rash.  Neurological:  Negative for dizziness and headaches.  Psychiatric/Behavioral:  Negative for agitation and dysphoric mood.        Increased stress - family stress.        Objective:     BP 124/76   Pulse 87   Temp 98.2 F (36.8 C)   Ht 5\' 3"  (1.6 m)   Wt 243 lb (110.2 kg)   SpO2 97%   BMI 43.05 kg/m  Wt Readings from Last 3 Encounters:  12/03/22 243 lb (110.2 kg)  07/28/22 243 lb 3.2 oz (110.3 kg)  04/21/22 242 lb 6.4 oz (110 kg)    Physical Exam Vitals reviewed.  Constitutional:      General: She is not in acute distress.    Appearance: Normal  appearance.  HENT:     Head: Normocephalic and atraumatic.     Right Ear: External ear normal.     Left Ear: External ear normal.  Eyes:     General: No scleral icterus.       Right eye: No discharge.        Left eye: No discharge.     Conjunctiva/sclera: Conjunctivae normal.  Neck:     Thyroid: No thyromegaly.  Cardiovascular:     Rate and Rhythm: Normal rate and regular rhythm.  Pulmonary:     Effort: No respiratory distress.     Breath sounds: Normal breath sounds. No wheezing.  Abdominal:     General: Bowel sounds are normal.     Palpations: Abdomen is soft.     Tenderness: There is no abdominal tenderness.  Musculoskeletal:        General: No swelling or tenderness.     Cervical back: Neck supple. No tenderness.  Lymphadenopathy:     Cervical: No cervical adenopathy.  Skin:    Findings: No erythema or rash.  Neurological:     Mental Status: She is alert.  Psychiatric:        Mood and Affect: Mood normal.        Behavior: Behavior normal.      Outpatient Encounter Medications as of 11/29/2022  Medication Sig   drospirenone-ethinyl estradiol (SYEDA) 3-0.03 MG tablet Take 1 tablet by mouth daily.   fluticasone (FLONASE) 50 MCG/ACT nasal spray Place 2 sprays into both nostrils daily.   hydrochlorothiazide (MICROZIDE) 12.5 MG capsule TAKE 1 CAPSULE BY MOUTH EVERY DAY   loratadine (CLARITIN) 10 MG tablet Take 10 mg by mouth daily.   LORazepam (ATIVAN) 0.5 MG tablet Take 1/2 tablet prior to procedure.   [DISCONTINUED] drospirenone-ethinyl estradiol (SYEDA) 3-0.03 MG tablet Take 1 tablet by mouth daily.   [DISCONTINUED] hydrochlorothiazide (MICROZIDE) 12.5 MG capsule TAKE 1 CAPSULE BY MOUTH EVERY DAY   No facility-administered encounter medications on file as of 11/29/2022.     Lab Results  Component Value Date   WBC 9.9 11/29/2022   HGB 13.9 11/29/2022   HCT 41.9 11/29/2022   PLT 363.0 11/29/2022   GLUCOSE 93 11/29/2022   CHOL 161 07/25/2022   TRIG 97.0  07/25/2022   HDL 60.60 07/25/2022   LDLCALC 81 07/25/2022   ALT 9 07/25/2022   AST 11 07/25/2022   NA 138 11/29/2022   K 3.9 11/29/2022   CL 105 11/29/2022   CREATININE 0.78 11/29/2022  BUN 14 11/29/2022   CO2 24 11/29/2022   TSH 2.71 07/25/2022   HGBA1C 5.3 04/14/2021    No results found.     Assessment & Plan:  Hypertension, unspecified type Assessment & Plan: Blood pressure doing well on hctz.  Tolerating.  Follow pressures. Have her spot check pressures and send in readings.  Follow metabolic panel.    Orders: -     CBC with Differential/Platelet -     Basic metabolic panel  Severe obesity (BMI >= 40) (HCC) Assessment & Plan: Have discussed diet and exercise.     Family history of breast cancer Assessment & Plan: In reviewing, does have family history of breast cancer.  Consider genetic testing.  Discussed.  Wants to hold.    Peripheral edema Assessment & Plan: No significant swelling.  Follow.      Other orders -     Drospirenone-Ethinyl Estradiol; Take 1 tablet by mouth daily.  Dispense: 84 tablet; Refill: 1 -     hydroCHLOROthiazide; TAKE 1 CAPSULE BY MOUTH EVERY DAY  Dispense: 90 capsule; Refill: 1     Dale [place], MD

## 2022-11-29 NOTE — Telephone Encounter (Signed)
Vaccination list printed and placed with pt paperwork for todays appointment

## 2022-12-03 ENCOUNTER — Encounter: Payer: Self-pay | Admitting: Internal Medicine

## 2022-12-03 NOTE — Assessment & Plan Note (Signed)
Blood pressure doing well on hctz.  Tolerating.  Follow pressures. Have her spot check pressures and send in readings.  Follow metabolic panel.

## 2022-12-03 NOTE — Assessment & Plan Note (Signed)
In reviewing, does have family history of breast cancer.  Consider genetic testing.  Discussed.  Wants to hold.

## 2022-12-03 NOTE — Assessment & Plan Note (Signed)
Have discussed diet and exercise.   

## 2022-12-03 NOTE — Assessment & Plan Note (Addendum)
No significant swelling.  Follow.

## 2023-05-03 ENCOUNTER — Encounter: Payer: Self-pay | Admitting: Internal Medicine

## 2023-05-04 MED ORDER — DROSPIRENONE-ETHINYL ESTRADIOL 3-0.03 MG PO TABS: 1.0000 | ORAL_TABLET | Freq: Every day | ORAL | 1 refills | Status: DC

## 2023-05-04 NOTE — Telephone Encounter (Signed)
Refilled ocp's.

## 2023-06-03 NOTE — Progress Notes (Signed)
Subjective:    [name], female    DOB: [birth year], 37 y.o.   MRN: [id number]  Patient here for  Chief Complaint  Patient presents with   Medical Management of Chronic Issues    HPI Here to follow up regarding hypertension. On hydrochlorothiazide. Saw gyn 08/30/22 - PAP. (PAP ok with negative HPV).  Per note, recommended f/u pap in 2028. Has a family history of breast cancer - paternal grandfather and maternal aunt. Discussed genetic testing.  Desires to hold. She just returned from [place].  Some pedal and ankle swelling.  Better now.  Discussed compression hose. Overall doing well.  Managing stress better.  Discussed staying active.  Low carb diet.  Blood pressure doing well.   Past Medical History:  Diagnosis Date   Allergy    Hypertension    Past Surgical History:  Procedure Laterality Date   FOOT SURGERY Right    removal of broken needle from foot 2000   WRIST SURGERY Left    Broken wrist repair 2020   Family History  Problem Relation Age of Onset   Hypertension Mother        PATWPW   Breast cancer Mother 31   Skin cancer Mother    Diabetes Mellitus II Mother    Atrial fibrillation Mother    Hyperlipidemia Mother    Asthma Father        PreDiabetes   Heart disease Maternal Grandmother    Stroke Maternal Grandmother    Esophageal cancer Maternal Grandfather    Liver cancer Maternal Grandfather    Hyperlipidemia Maternal Grandfather    Hypertension Maternal Grandfather    Parkinson's disease Paternal Grandmother    Anemia Paternal Grandmother    Hypotension Paternal Grandmother    Cataracts Paternal Grandmother    Breast cancer Paternal Grandfather    Diabetes Mellitus II Paternal Grandfather    Heart disease Paternal Grandfather    Liver cancer Paternal Grandfather    Hypertension Maternal Aunt    Breast cancer Maternal Aunt 64   Ulcerative colitis Paternal Aunt    Diabetes Mellitus II Paternal Aunt    Cataracts Paternal Aunt    Anemia  Paternal Aunt    Parkinson's disease Paternal Aunt    Hypertension Paternal Aunt    Hyperlipidemia Paternal Aunt    Fibroids Paternal Aunt    Other Paternal Aunt    Social History   Socioeconomic History   Marital status: Single    Spouse name: Not on file   Number of children: Not on file   Years of education: Not on file   Highest education level: Not on file  Occupational History   Not on file  Tobacco Use   Smoking status: Never   Smokeless tobacco: Never  Substance and Sexual Activity   Alcohol use: Yes    Alcohol/week: 1.0 standard drink of alcohol    Types: 1 Glasses of wine per week   Drug use: Never   Sexual activity: Not Currently  Other Topics Concern   Not on file  Social History Narrative   Not on file   Social Determinants of Health   Financial Resource Strain: Not on file  Food Insecurity: Not on file  Transportation Needs: Not on file  Physical Activity: Not on file  Stress: Not on file  Social Connections: Not on file     Review of Systems  Constitutional:  Negative for appetite change and unexpected weight change.  HENT:  Negative for congestion and  sinus pressure.   Respiratory:  Negative for cough, chest tightness and shortness of breath.   Cardiovascular:  Negative for chest pain and palpitations.       Some swelling - pedal and ankle swelling - after returning home from [place].  Better now.   Gastrointestinal:  Negative for abdominal pain, diarrhea, nausea and vomiting.  Genitourinary:  Negative for difficulty urinating and dysuria.  Musculoskeletal:  Negative for joint swelling and myalgias.  Skin:  Negative for color change and rash.  Neurological:  Negative for dizziness and headaches.  Psychiatric/Behavioral:  Negative for agitation and dysphoric mood.        Objective:     BP 118/76   Pulse 89   Temp 97.9 F (36.6 C)   Resp 16   Ht 5\' 4"  (1.626 m)   Wt 248 lb (112.5 kg)   SpO2 98%   BMI 42.57 kg/m  Wt Readings from Last  3 Encounters:  06/04/23 248 lb (112.5 kg)  12/03/22 243 lb (110.2 kg)  07/28/22 243 lb 3.2 oz (110.3 kg)    Physical Exam Vitals reviewed.  Constitutional:      General: She is not in acute distress.    Appearance: Normal appearance.  HENT:     Head: Normocephalic and atraumatic.     Right Ear: External ear normal.     Left Ear: External ear normal.  Eyes:     General: No scleral icterus.       Right eye: No discharge.        Left eye: No discharge.     Conjunctiva/sclera: Conjunctivae normal.  Neck:     Thyroid: No thyromegaly.  Cardiovascular:     Rate and Rhythm: Normal rate and regular rhythm.  Pulmonary:     Effort: No respiratory distress.     Breath sounds: Normal breath sounds. No wheezing.  Abdominal:     General: Bowel sounds are normal.     Palpations: Abdomen is soft.     Tenderness: There is no abdominal tenderness.  Musculoskeletal:        General: No swelling or tenderness.     Cervical back: Neck supple. No tenderness.  Lymphadenopathy:     Cervical: No cervical adenopathy.  Skin:    Findings: No erythema or rash.  Neurological:     Mental Status: She is alert.  Psychiatric:        Mood and Affect: Mood normal.        Behavior: Behavior normal.      Outpatient Encounter Medications as of 06/04/2023  Medication Sig   drospirenone-ethinyl estradiol (SYEDA) 3-0.03 MG tablet Take 1 tablet by mouth daily.   fluticasone (FLONASE) 50 MCG/ACT nasal spray Place 2 sprays into both nostrils daily.   hydrochlorothiazide (MICROZIDE) 12.5 MG capsule TAKE 1 CAPSULE BY MOUTH EVERY DAY   loratadine (CLARITIN) 10 MG tablet Take 10 mg by mouth daily.   [DISCONTINUED] drospirenone-ethinyl estradiol (SYEDA) 3-0.03 MG tablet Take 1 tablet by mouth daily.   [DISCONTINUED] hydrochlorothiazide (MICROZIDE) 12.5 MG capsule TAKE 1 CAPSULE BY MOUTH EVERY DAY   [DISCONTINUED] LORazepam (ATIVAN) 0.5 MG tablet Take 1/2 tablet prior to procedure.   No facility-administered  encounter medications on file as of 06/04/2023.     Lab Results  Component Value Date   WBC 9.9 11/29/2022   HGB 13.9 11/29/2022   HCT 41.9 11/29/2022   PLT 363.0 11/29/2022   GLUCOSE 88 06/04/2023   CHOL 176 06/04/2023   TRIG 104.0 06/04/2023  HDL 61.30 06/04/2023   LDLCALC 94 06/04/2023   ALT 12 06/04/2023   AST 16 06/04/2023   NA 138 06/04/2023   K 3.3 (L) 06/04/2023   CL 103 06/04/2023   CREATININE 0.69 06/04/2023   BUN 7 06/04/2023   CO2 24 06/04/2023   TSH 2.71 07/25/2022   HGBA1C 5.3 04/14/2021       Assessment & Plan:  Hypertension, unspecified type Assessment & Plan: Blood pressure doing well on hctz.  Tolerating.  Follow pressures.  Follow metabolic panel.    Orders: -     Comprehensive metabolic panel  Screening cholesterol level -     Lipid panel  Ankle edema Assessment & Plan: Travel.  Discussed.  Better.  Stay hydrated.  Leg elevation.  Compression hose.    Family history of breast cancer Assessment & Plan: In reviewing, does have family history of breast cancer.  Consider genetic testing.  Discussed.  Wants to hold.    Other orders -     Drospirenone-Ethinyl Estradiol; Take 1 tablet by mouth daily.  Dispense: 84 tablet; Refill: 1 -     hydroCHLOROthiazide; TAKE 1 CAPSULE BY MOUTH EVERY DAY  Dispense: 90 capsule; Refill: 1     Dale [place], MD

## 2023-06-04 ENCOUNTER — Ambulatory Visit: Payer: Managed Care, Other (non HMO) | Admitting: Internal Medicine

## 2023-06-04 VITALS — BP 118/76 | HR 89 | Temp 97.9°F | Resp 16 | Ht 64.0 in | Wt 248.0 lb

## 2023-06-04 DIAGNOSIS — M25473 Effusion, unspecified ankle: Secondary | ICD-10-CM | POA: Diagnosis not present

## 2023-06-04 DIAGNOSIS — Z803 Family history of malignant neoplasm of breast: Secondary | ICD-10-CM

## 2023-06-04 DIAGNOSIS — I10 Essential (primary) hypertension: Secondary | ICD-10-CM | POA: Diagnosis not present

## 2023-06-04 DIAGNOSIS — Z13220 Encounter for screening for lipoid disorders: Secondary | ICD-10-CM | POA: Diagnosis not present

## 2023-06-04 LAB — COMPREHENSIVE METABOLIC PANEL
ALT: 12 U/L (ref 0–35)
AST: 16 U/L (ref 0–37)
Albumin: 3.8 g/dL (ref 3.5–5.2)
Alkaline Phosphatase: 92 U/L (ref 39–117)
BUN: 7 mg/dL (ref 6–23)
CO2: 24 mEq/L (ref 19–32)
Calcium: 9 mg/dL (ref 8.4–10.5)
Chloride: 103 mEq/L (ref 96–112)
Creatinine, Ser: 0.69 mg/dL (ref 0.40–1.20)
GFR: 112.58 mL/min (ref >60.00)
Glucose, Bld: 88 mg/dL (ref 70–99)
Potassium: 3.3 mEq/L — ABNORMAL LOW (ref 3.5–5.1)
Sodium: 138 mEq/L (ref 135–145)
Total Bilirubin: 0.6 mg/dL (ref 0.2–1.2)
Total Protein: 7.2 g/dL (ref 6.0–8.3)

## 2023-06-04 LAB — LIPID PANEL
Cholesterol: 176 mg/dL (ref 0–200)
HDL: 61.3 mg/dL (ref >39.00)
LDL Cholesterol: 94 mg/dL (ref 0–99)
NonHDL: 114.49
Total CHOL/HDL Ratio: 3
Triglycerides: 104 mg/dL (ref 0.0–149.0)
VLDL: 20.8 mg/dL (ref 0.0–40.0)

## 2023-06-04 MED ORDER — HYDROCHLOROTHIAZIDE 12.5 MG PO CAPS: ORAL_CAPSULE | ORAL | 1 refills | Status: DC

## 2023-06-04 MED ORDER — DROSPIRENONE-ETHINYL ESTRADIOL 3-0.03 MG PO TABS: 1.0000 | ORAL_TABLET | Freq: Every day | ORAL | 1 refills | Status: DC

## 2023-06-09 ENCOUNTER — Encounter: Payer: Self-pay | Admitting: Internal Medicine

## 2023-06-09 NOTE — Assessment & Plan Note (Signed)
Travel.  Discussed.  Better.  Stay hydrated.  Leg elevation.  Compression hose.

## 2023-06-09 NOTE — Assessment & Plan Note (Signed)
Blood pressure doing well on hctz.  Tolerating.  Follow pressures.  Follow metabolic panel.   

## 2023-06-09 NOTE — Assessment & Plan Note (Signed)
In reviewing, does have family history of breast cancer.  Consider genetic testing.  Discussed.  Wants to hold.

## 2023-06-11 ENCOUNTER — Telehealth: Payer: Self-pay | Admitting: Internal Medicine

## 2023-06-11 DIAGNOSIS — E876 Hypokalemia: Secondary | ICD-10-CM

## 2023-06-11 NOTE — Telephone Encounter (Signed)
Patient need lab orders.

## 2023-06-11 NOTE — Telephone Encounter (Signed)
Order placed for f/u potassium check.  

## 2023-06-18 ENCOUNTER — Other Ambulatory Visit (INDEPENDENT_AMBULATORY_CARE_PROVIDER_SITE_OTHER): Payer: Managed Care, Other (non HMO)

## 2023-06-18 DIAGNOSIS — E876 Hypokalemia: Secondary | ICD-10-CM

## 2023-10-05 ENCOUNTER — Ambulatory Visit (INDEPENDENT_AMBULATORY_CARE_PROVIDER_SITE_OTHER): Payer: Managed Care, Other (non HMO) | Admitting: Internal Medicine

## 2023-10-05 ENCOUNTER — Encounter: Payer: Self-pay | Admitting: Internal Medicine

## 2023-10-05 VITALS — BP 126/72 | HR 89 | Temp 98.3°F | Resp 16 | Ht 63.0 in | Wt 243.0 lb

## 2023-10-05 DIAGNOSIS — Z803 Family history of malignant neoplasm of breast: Secondary | ICD-10-CM | POA: Diagnosis not present

## 2023-10-05 DIAGNOSIS — M25473 Effusion, unspecified ankle: Secondary | ICD-10-CM

## 2023-10-05 DIAGNOSIS — Z0000 Encounter for general adult medical examination without abnormal findings: Secondary | ICD-10-CM

## 2023-10-05 DIAGNOSIS — I10 Essential (primary) hypertension: Secondary | ICD-10-CM

## 2023-10-05 DIAGNOSIS — Z13220 Encounter for screening for lipoid disorders: Secondary | ICD-10-CM

## 2023-10-05 LAB — CBC WITH DIFFERENTIAL/PLATELET
Basophils Absolute: 0.1 10*3/uL (ref 0.0–0.1)
Basophils Relative: 0.7 % (ref 0.0–3.0)
Eosinophils Absolute: 0.2 10*3/uL (ref 0.0–0.7)
Eosinophils Relative: 2.3 % (ref 0.0–5.0)
HCT: 40.5 % (ref 36.0–46.0)
Hemoglobin: 13.1 g/dL (ref 12.0–15.0)
Lymphocytes Relative: 30 % (ref 12.0–46.0)
Lymphs Abs: 3.1 10*3/uL (ref 0.7–4.0)
MCHC: 32.4 g/dL (ref 30.0–36.0)
MCV: 87.9 fL (ref 78.0–100.0)
Monocytes Absolute: 0.5 10*3/uL (ref 0.1–1.0)
Monocytes Relative: 4.9 % (ref 3.0–12.0)
Neutro Abs: 6.4 10*3/uL (ref 1.4–7.7)
Neutrophils Relative %: 62.1 % (ref 43.0–77.0)
Platelets: 373 10*3/uL (ref 150.0–400.0)
RBC: 4.61 Mil/uL (ref 3.87–5.11)
RDW: 13.9 % (ref 11.5–15.5)
WBC: 10.4 10*3/uL (ref 4.0–10.5)

## 2023-10-05 LAB — COMPREHENSIVE METABOLIC PANEL
ALT: 9 U/L (ref 0–35)
AST: 11 U/L (ref 0–37)
Albumin: 3.9 g/dL (ref 3.5–5.2)
Alkaline Phosphatase: 96 U/L (ref 39–117)
BUN: 11 mg/dL (ref 6–23)
CO2: 23 meq/L (ref 19–32)
Calcium: 9.3 mg/dL (ref 8.4–10.5)
Chloride: 104 meq/L (ref 96–112)
Creatinine, Ser: 0.74 mg/dL (ref 0.40–1.20)
GFR: 104.7 mL/min (ref >60.00)
Glucose, Bld: 80 mg/dL (ref 70–99)
Potassium: 3.9 meq/L (ref 3.5–5.1)
Sodium: 136 meq/L (ref 135–145)
Total Bilirubin: 0.3 mg/dL (ref 0.2–1.2)
Total Protein: 7.6 g/dL (ref 6.0–8.3)

## 2023-10-05 LAB — TSH: TSH: 2.83 u[IU]/mL (ref 0.35–5.50)

## 2023-10-05 MED ORDER — HYDROCHLOROTHIAZIDE 12.5 MG PO CAPS: ORAL_CAPSULE | ORAL | 1 refills | Status: DC

## 2023-10-05 MED ORDER — DROSPIRENONE-ETHINYL ESTRADIOL 3-0.03 MG PO TABS: 1.0000 | ORAL_TABLET | Freq: Every day | ORAL | 1 refills | Status: DC

## 2023-10-05 NOTE — Assessment & Plan Note (Signed)
Improved - watching sodium intake.  Decreased processed foods.

## 2023-10-05 NOTE — Progress Notes (Signed)
Subjective:    [name], female    DOB: [birth year], 37 y.o.   MRN: [id number]  Patient here for  Chief Complaint  Patient presents with   Annual Exam    HPI Here for a physical exam. On hydrochlorothiazide for her blood pressure. Saw gyn 08/30/22 - PAP. (PAP ok with negative HPV).  Per note, recommended f/u pap in 2028. Has a family history of breast cancer - paternal grandfather and maternal aunt. Have discussed genetic testing. Discussed today. Will notify me if desires further testing. No chest pain or sob reported.  No cough or congestion reported.  Recent trip to [place].  Periods regular.  LMP ended approximately one week ago.     Past Medical History:  Diagnosis Date   Allergy    Hypertension    Past Surgical History:  Procedure Laterality Date   FOOT SURGERY Right    removal of broken needle from foot 2000   WRIST SURGERY Left    Broken wrist repair 2020   Family History  Problem Relation Age of Onset   Hypertension Mother        PATWPW   Breast cancer Mother 24   Skin cancer Mother    Diabetes Mellitus II Mother    Atrial fibrillation Mother    Hyperlipidemia Mother    Asthma Father        PreDiabetes   Heart disease Maternal Grandmother    Stroke Maternal Grandmother    Esophageal cancer Maternal Grandfather    Liver cancer Maternal Grandfather    Hyperlipidemia Maternal Grandfather    Hypertension Maternal Grandfather    Parkinson's disease Paternal Grandmother    Anemia Paternal Grandmother    Hypotension Paternal Grandmother    Cataracts Paternal Grandmother    Breast cancer Paternal Grandfather    Diabetes Mellitus II Paternal Grandfather    Heart disease Paternal Grandfather    Liver cancer Paternal Grandfather    Hypertension Maternal Aunt    Breast cancer Maternal Aunt 64   Ulcerative colitis Paternal Aunt    Diabetes Mellitus II Paternal Aunt    Cataracts Paternal Aunt    Anemia Paternal Aunt    Parkinson's disease  Paternal Aunt    Hypertension Paternal Aunt    Hyperlipidemia Paternal Aunt    Fibroids Paternal Aunt    Other Paternal Aunt    Social History   Socioeconomic History   Marital status: Single    Spouse name: Not on file   Number of children: Not on file   Years of education: Not on file   Highest education level: Not on file  Occupational History   Not on file  Tobacco Use   Smoking status: Never   Smokeless tobacco: Never  Substance and Sexual Activity   Alcohol use: Yes    Alcohol/week: 1.0 standard drink of alcohol    Types: 1 Glasses of wine per week   Drug use: Never   Sexual activity: Not Currently  Other Topics Concern   Not on file  Social History Narrative   Not on file   Social Determinants of Health   Financial Resource Strain: Not on file  Food Insecurity: Not on file  Transportation Needs: Not on file  Physical Activity: Not on file  Stress: Not on file  Social Connections: Not on file     Review of Systems  Constitutional:  Negative for appetite change and unexpected weight change.  HENT:  Negative for congestion, sinus pressure and  sore throat.   Eyes:  Negative for pain and visual disturbance.  Respiratory:  Negative for cough, chest tightness and shortness of breath.   Cardiovascular:  Negative for chest pain and palpitations.       No increased swelling.  Watching salt intake.   Gastrointestinal:  Negative for abdominal pain, diarrhea, nausea and vomiting.  Genitourinary:  Negative for difficulty urinating and dysuria.  Musculoskeletal:  Negative for joint swelling and myalgias.  Skin:  Negative for color change and rash.  Neurological:  Negative for dizziness and headaches.  Hematological:  Negative for adenopathy. Does not bruise/bleed easily.  Psychiatric/Behavioral:  Negative for agitation and dysphoric mood.        Objective:     BP 126/72   Pulse 89   Temp 98.3 F (36.8 C)   Resp 16   Ht 5\' 3"  (1.6 m)   Wt 243 lb (110.2 kg)    SpO2 98%   BMI 43.05 kg/m  Wt Readings from Last 3 Encounters:  10/05/23 243 lb (110.2 kg)  06/04/23 248 lb (112.5 kg)  12/03/22 243 lb (110.2 kg)    Physical Exam Vitals reviewed.  Constitutional:      General: She is not in acute distress.    Appearance: Normal appearance. She is well-developed.  HENT:     Head: Normocephalic and atraumatic.     Right Ear: External ear normal.     Left Ear: External ear normal.     Nose: No congestion or rhinorrhea.  Eyes:     General: No scleral icterus.       Right eye: No discharge.        Left eye: No discharge.     Conjunctiva/sclera: Conjunctivae normal.  Neck:     Thyroid: No thyromegaly.  Cardiovascular:     Rate and Rhythm: Normal rate and regular rhythm.  Pulmonary:     Effort: No tachypnea, accessory muscle usage or respiratory distress.     Breath sounds: Normal breath sounds. No decreased breath sounds or wheezing.  Chest:  Breasts:    Right: No inverted nipple, mass, nipple discharge or tenderness (no axillary adenopathy).     Left: No inverted nipple, mass, nipple discharge or tenderness (no axilarry adenopathy).  Abdominal:     General: Bowel sounds are normal.     Palpations: Abdomen is soft.     Tenderness: There is no abdominal tenderness.  Musculoskeletal:        General: No swelling or tenderness.     Cervical back: Neck supple.  Lymphadenopathy:     Cervical: No cervical adenopathy.  Skin:    Findings: No erythema or rash.  Neurological:     Mental Status: She is alert and oriented to person, place, and time.  Psychiatric:        Mood and Affect: Mood normal.        Behavior: Behavior normal.      Outpatient Encounter Medications as of 10/05/2023  Medication Sig   fluticasone (FLONASE) 50 MCG/ACT nasal spray Place 2 sprays into both nostrils daily.   loratadine (CLARITIN) 10 MG tablet Take 10 mg by mouth daily.   [DISCONTINUED] drospirenone-ethinyl estradiol (SYEDA) 3-0.03 MG tablet Take 1 tablet by  mouth daily.   [DISCONTINUED] hydrochlorothiazide (MICROZIDE) 12.5 MG capsule TAKE 1 CAPSULE BY MOUTH EVERY DAY   drospirenone-ethinyl estradiol (SYEDA) 3-0.03 MG tablet Take 1 tablet by mouth daily.   hydrochlorothiazide (MICROZIDE) 12.5 MG capsule TAKE 1 CAPSULE BY MOUTH EVERY DAY  No facility-administered encounter medications on file as of 10/05/2023.     Lab Results  Component Value Date   WBC 9.9 11/29/2022   HGB 13.9 11/29/2022   HCT 41.9 11/29/2022   PLT 363.0 11/29/2022   GLUCOSE 88 06/04/2023   CHOL 176 06/04/2023   TRIG 104.0 06/04/2023   HDL 61.30 06/04/2023   LDLCALC 94 06/04/2023   ALT 12 06/04/2023   AST 16 06/04/2023   NA 138 06/04/2023   K 3.8 06/18/2023   CL 103 06/04/2023   CREATININE 0.69 06/04/2023   BUN 7 06/04/2023   CO2 24 06/04/2023   TSH 2.71 07/25/2022   HGBA1C 5.3 04/14/2021       Assessment & Plan:  Routine general medical examination at a health care facility  Hypertension, unspecified type Assessment & Plan: Blood pressure doing well on hctz.  Tolerating.  Follow pressures.  Follow metabolic panel.    Orders: -     CBC with Differential/Platelet -     Comprehensive metabolic panel -     TSH  Screening cholesterol level  Healthcare maintenance Assessment & Plan: Physical today   PAP at/22/24. tempted.  GYN - PAP - 08/30/22 - ok with negative HPV. Diagnostic mammogram 05/2020 - [facility].    Ankle edema Assessment & Plan: Improved - watching sodium intake.  Decreased processed foods.    Family history of breast cancer Assessment & Plan: In reviewing, does have family history of breast cancer.  Consider genetic testing.  Discussed.  Wants to hold.    Other orders -     Drospirenone-Ethinyl Estradiol; Take 1 tablet by mouth daily.  Dispense: 84 tablet; Refill: 1 -     hydroCHLOROthiazide; TAKE 1 CAPSULE BY MOUTH EVERY DAY  Dispense: 90 capsule; Refill: 1     Dale [place], MD

## 2023-10-05 NOTE — Assessment & Plan Note (Signed)
Blood pressure doing well on hctz.  Tolerating.  Follow pressures.  Follow metabolic panel.

## 2023-10-05 NOTE — Assessment & Plan Note (Signed)
In reviewing, does have family history of breast cancer.  Consider genetic testing.  Discussed.  Wants to hold.

## 2023-10-05 NOTE — Assessment & Plan Note (Signed)
Physical today   PAP at/22/24. tempted.  GYN - PAP - 08/30/22 - ok with negative HPV. Diagnostic mammogram 05/2020 - [facility].

## 2024-03-29 ENCOUNTER — Other Ambulatory Visit: Payer: Self-pay | Admitting: Internal Medicine

## 2024-04-03 ENCOUNTER — Encounter: Payer: Self-pay | Admitting: Internal Medicine

## 2024-04-03 ENCOUNTER — Ambulatory Visit (INDEPENDENT_AMBULATORY_CARE_PROVIDER_SITE_OTHER): Payer: Managed Care, Other (non HMO) | Admitting: Internal Medicine

## 2024-04-03 VITALS — BP 136/80 | HR 86 | Temp 98.0°F | Resp 16 | Ht 63.0 in | Wt 240.0 lb

## 2024-04-03 DIAGNOSIS — R0981 Nasal congestion: Secondary | ICD-10-CM | POA: Diagnosis not present

## 2024-04-03 DIAGNOSIS — I10 Essential (primary) hypertension: Secondary | ICD-10-CM

## 2024-04-03 LAB — BASIC METABOLIC PANEL WITH GFR
BUN: 16 mg/dL (ref 6–23)
CO2: 24 meq/L (ref 19–32)
Calcium: 9.3 mg/dL (ref 8.4–10.5)
Chloride: 106 meq/L (ref 96–112)
Creatinine, Ser: 0.74 mg/dL (ref 0.40–1.20)
GFR: 104.34 mL/min (ref >60.00)
Glucose, Bld: 95 mg/dL (ref 70–99)
Potassium: 4 meq/L (ref 3.5–5.1)
Sodium: 138 meq/L (ref 135–145)

## 2024-04-03 NOTE — Assessment & Plan Note (Signed)
 Blood pressure as outlined. On hydrochlorothiazide .  Tolerating.  Follow pressures.  Check metabolic panel today.

## 2024-04-03 NOTE — Progress Notes (Signed)
 Subjective:    [name], female    DOB: December 23, 1985, 38 y.o.   MRN: [id number]  Patient here for  Chief Complaint  Patient presents with   Medical Management of Chronic Issues    HPI Here for a scheduled follow up - follow up regarding hypertension. Continues on hydrochlorothiazide . Saw gyn 08/30/22 - PAP. (PAP ok with negative HPV). She reports she has been doing relatively well. Starting last 8 days ago noticed increased nasal drainage and cough. No sinus pressure. Some fatigue. Previous subjective fever. Irritated throat from cough. Some cough. No chest congestion or sob. No acid reflux. No vomiting. Some diarrhea. Using flonase , neti pot and taking ibuprofen.    Past Medical History:  Diagnosis Date   Allergy    Hypertension    Past Surgical History:  Procedure Laterality Date   FOOT SURGERY Right    removal of broken needle from foot 2000   WRIST SURGERY Left    Broken wrist repair 2020   Family History  Problem Relation Age of Onset   Hypertension Mother        PATWPW   Breast cancer Mother 43   Skin cancer Mother    Diabetes Mellitus II Mother    Atrial fibrillation Mother    Hyperlipidemia Mother    Asthma Father        PreDiabetes   Heart disease Maternal Grandmother    Stroke Maternal Grandmother    Esophageal cancer Maternal Grandfather    Liver cancer Maternal Grandfather    Hyperlipidemia Maternal Grandfather    Hypertension Maternal Grandfather    Parkinson's disease Paternal Grandmother    Anemia Paternal Grandmother    Hypotension Paternal Grandmother    Cataracts Paternal Grandmother    Breast cancer Paternal Grandfather    Diabetes Mellitus II Paternal Grandfather    Heart disease Paternal Grandfather    Liver cancer Paternal Grandfather    Hypertension Maternal Aunt    Breast cancer Maternal Aunt 64   Ulcerative colitis Paternal Aunt    Diabetes Mellitus II Paternal Aunt    Cataracts Paternal Aunt    Anemia Paternal Aunt     Parkinson's disease Paternal Aunt    Hypertension Paternal Aunt    Hyperlipidemia Paternal Aunt    Fibroids Paternal Aunt    Other Paternal Aunt    Social History   Socioeconomic History   Marital status: Single    Spouse name: Not on file   Number of children: Not on file   Years of education: Not on file   Highest education level: Bachelor's degree (e.g., BA, AB, BS)  Occupational History   Not on file  Tobacco Use   Smoking status: Never   Smokeless tobacco: Never  Substance and Sexual Activity   Alcohol use: Yes    Alcohol/week: 1.0 standard drink of alcohol    Types: 1 Glasses of wine per week   Drug use: Never   Sexual activity: Not Currently  Other Topics Concern   Not on file  Social History Narrative   Not on file   Social Drivers of Health   Financial Resource Strain: Patient Declined (03/30/2024)   Overall Financial Resource Strain (CARDIA)    Difficulty of Paying Living Expenses: Patient declined  Food Insecurity: No Food Insecurity (03/30/2024)   Hunger Vital Sign    Worried About Running Out of Food in the Last Year: Never true    Ran Out of Food in the Last Year: Never true  Transportation Needs: No Transportation Needs (03/30/2024)   PRAPARE - Administrator, Civil Service (Medical): No    Lack of Transportation (Non-Medical): No  Physical Activity: Insufficiently Active (03/30/2024)   Exercise Vital Sign    Days of Exercise per Week: 1 day    Minutes of Exercise per Session: 30 min  Stress: No Stress Concern Present (03/30/2024)   [employer] of Occupational Health - Occupational Stress Questionnaire    Feeling of Stress : Only a little  Social Connections: Unknown (03/30/2024)   Social Connection and Isolation Panel [NHANES]    Frequency of Communication with Friends and Family: More than three times a week    Frequency of Social Gatherings with Friends and Family: Once a week    Attends Religious Services: Patient declined    Automotive engineer or Organizations: No    Attends Engineer, structural: Not on file    Marital Status: Never married     Review of Systems  Constitutional:  Negative for appetite change and unexpected weight change.  HENT:  Positive for congestion and postnasal drip. Negative for sinus pressure.        Irritated throat.   Respiratory:  Positive for cough. Negative for chest tightness and shortness of breath.   Cardiovascular:  Negative for chest pain, palpitations and leg swelling.  Gastrointestinal:  Negative for abdominal pain, nausea and vomiting.       Some loose stool.   Genitourinary:  Negative for difficulty urinating and dysuria.  Musculoskeletal:  Negative for joint swelling and myalgias.  Skin:  Negative for color change and rash.  Neurological:  Negative for dizziness and headaches.  Psychiatric/Behavioral:  Negative for agitation and dysphoric mood.        Objective:     BP 136/80   Pulse 86   Temp 98 F (36.7 C)   Resp 16   Ht 5\' 3"  (1.6 m)   Wt 240 lb (108.9 kg)   SpO2 99%   BMI 42.51 kg/m  Wt Readings from Last 3 Encounters:  04/03/24 240 lb (108.9 kg)  10/05/23 243 lb (110.2 kg)  06/04/23 248 lb (112.5 kg)    Physical Exam Vitals reviewed.  Constitutional:      General: She is not in acute distress.    Appearance: Normal appearance.  HENT:     Head: Normocephalic and atraumatic.     Right Ear: Tympanic membrane, ear canal and external ear normal.     Left Ear: Tympanic membrane, ear canal and external ear normal.     Mouth/Throat:     Pharynx: No oropharyngeal exudate or posterior oropharyngeal erythema.  Eyes:     General: No scleral icterus.       Right eye: No discharge.        Left eye: No discharge.     Conjunctiva/sclera: Conjunctivae normal.  Neck:     Thyroid : No thyromegaly.  Cardiovascular:     Rate and Rhythm: Normal rate and regular rhythm.  Pulmonary:     Effort: No respiratory distress.     Breath sounds: Normal  breath sounds. No wheezing.  Abdominal:     General: Bowel sounds are normal.     Palpations: Abdomen is soft.     Tenderness: There is no abdominal tenderness.  Musculoskeletal:        General: No swelling or tenderness.     Cervical back: Neck supple. No tenderness.  Lymphadenopathy:     Cervical: No  cervical adenopathy.  Skin:    Findings: No erythema or rash.  Neurological:     Mental Status: She is alert.  Psychiatric:        Mood and Affect: Mood normal.        Behavior: Behavior normal.         Outpatient Encounter Medications as of 04/03/2024  Medication Sig   fluticasone  (FLONASE ) 50 MCG/ACT nasal spray Place 2 sprays into both nostrils daily.   hydrochlorothiazide  (MICROZIDE ) 12.5 MG capsule TAKE 1 CAPSULE BY MOUTH EVERY DAY   loratadine (CLARITIN) 10 MG tablet Take 10 mg by mouth daily.   SYEDA  3-0.03 MG tablet TAKE 1 TABLET BY MOUTH EVERY DAY   No facility-administered encounter medications on file as of 04/03/2024.     Lab Results  Component Value Date   WBC 10.4 10/05/2023   HGB 13.1 10/05/2023   HCT 40.5 10/05/2023   PLT 373.0 10/05/2023   GLUCOSE 80 10/05/2023   CHOL 176 06/04/2023   TRIG 104.0 06/04/2023   HDL 61.30 06/04/2023   LDLCALC 94 06/04/2023   ALT 9 10/05/2023   AST 11 10/05/2023   NA 136 10/05/2023   K 3.9 10/05/2023   CL 104 10/05/2023   CREATININE 0.74 10/05/2023   BUN 11 10/05/2023   CO2 23 10/05/2023   TSH 2.83 10/05/2023   HGBA1C 5.3 04/14/2021    No results found.     Assessment & Plan:  Hypertension, unspecified type Assessment & Plan: Blood pressure as outlined. On hydrochlorothiazide .  Tolerating.  Follow pressures.  Check metabolic panel today.   Orders: -     Basic metabolic panel with GFR  Congestion of nasal sinus Assessment & Plan: Continue flonase  and neti pot. Robitussin DM as directed. Follow.  Call with update.       Dellar Fenton, MD

## 2024-04-03 NOTE — Assessment & Plan Note (Signed)
 Continue flonase  and neti pot. Robitussin DM as directed. Follow.  Call with update.

## 2024-04-04 ENCOUNTER — Ambulatory Visit: Payer: Self-pay | Admitting: Internal Medicine

## 2024-04-08 ENCOUNTER — Encounter: Payer: Self-pay | Admitting: Internal Medicine

## 2024-04-08 NOTE — Telephone Encounter (Signed)
 LM for patient to schedule at 1 with Dr Geralyn Knee tomorrow

## 2024-04-08 NOTE — Telephone Encounter (Signed)
 Pt discussed this with you last week

## 2024-04-08 NOTE — Telephone Encounter (Signed)
 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

## 2024-04-09 ENCOUNTER — Telehealth (INDEPENDENT_AMBULATORY_CARE_PROVIDER_SITE_OTHER): Admitting: Internal Medicine

## 2024-04-09 DIAGNOSIS — J329 Chronic sinusitis, unspecified: Secondary | ICD-10-CM

## 2024-04-09 MED ORDER — AMOXICILLIN-POT CLAVULANATE 875-125 MG PO TABS: 1.0000 | ORAL_TABLET | Freq: Two times a day (BID) | ORAL | 0 refills | Status: DC

## 2024-04-09 NOTE — Telephone Encounter (Signed)
 Pt scheduled

## 2024-04-09 NOTE — Progress Notes (Signed)
 [name], female   DOB: 1986/10/16, 38 y.o.   MRN: [id number]   Virtual Visit via vidoe Note  I connected with Maureen Reed by a video enabled telemedicine application and verified that I am speaking with the correct person using two identifiers. Location patient: home Location provider: work  Persons participating in the virtual visit: patient, provider  The limitations, risks, security and privacy concerns of performing an evaluation and management service by video and the availability of in person appointments have been discussed. It has also been discussed with the patient that there may be a patient responsible charge related to this service. The patient expressed understanding and agreed to proceed.   Reason for visit: work in appt  HPI: Work in with concerns regarding persistent sinus symptoms. She was seen 04/03/24 - reported one week history of increased nasal drainage and cough. Irritated throat from cough. Instructed to continue flonase  and neti pot. Added robitussin DM. Reports improvement in her cough, but notes persistetn sinus pressure/nasal pressure. Some colored mucus production. No fever. No sore throat now. No chest congestion or sob. No vomiting. Had reported some nausea when eating.    ROS: See pertinent positives and negatives per HPI.  Past Medical History:  Diagnosis Date   Allergy    Hypertension     Past Surgical History:  Procedure Laterality Date   FOOT SURGERY Right    removal of broken needle from foot 2000   WRIST SURGERY Left    Broken wrist repair 2020    Family History  Problem Relation Age of Onset   Hypertension Mother        PATWPW   Breast cancer Mother 48   Skin cancer Mother    Diabetes Mellitus II Mother    Atrial fibrillation Mother    Hyperlipidemia Mother    Asthma Father        PreDiabetes   Heart disease Maternal Grandmother    Stroke Maternal Grandmother    Esophageal cancer Maternal Grandfather    Liver  cancer Maternal Grandfather    Hyperlipidemia Maternal Grandfather    Hypertension Maternal Grandfather    Parkinson's disease Paternal Grandmother    Anemia Paternal Grandmother    Hypotension Paternal Grandmother    Cataracts Paternal Grandmother    Breast cancer Paternal Grandfather    Diabetes Mellitus II Paternal Grandfather    Heart disease Paternal Grandfather    Liver cancer Paternal Grandfather    Hypertension Maternal Aunt    Breast cancer Maternal Aunt 64   Ulcerative colitis Paternal Aunt    Diabetes Mellitus II Paternal Aunt    Cataracts Paternal Aunt    Anemia Paternal Aunt    Parkinson's disease Paternal Aunt    Hypertension Paternal Aunt    Hyperlipidemia Paternal Aunt    Fibroids Paternal Aunt    Other Paternal Aunt     SOCIAL HX: reviewed.    Current Outpatient Medications:    amoxicillin -clavulanate (AUGMENTIN ) 875-125 MG tablet, Take 1 tablet by mouth 2 (two) times daily., Disp: 14 tablet, Rfl: 0   fluticasone  (FLONASE ) 50 MCG/ACT nasal spray, Place 2 sprays into both nostrils daily., Disp: 16 g, Rfl: 0   hydrochlorothiazide  (MICROZIDE ) 12.5 MG capsule, TAKE 1 CAPSULE BY MOUTH EVERY DAY, Disp: 90 capsule, Rfl: 1   loratadine (CLARITIN) 10 MG tablet, Take 10 mg by mouth daily., Disp: , Rfl:    SYEDA  3-0.03 MG tablet, TAKE 1 TABLET BY MOUTH EVERY DAY, Disp: 84 tablet, Rfl: 1  EXAM:  GENERAL: alert, oriented, appears well and in no acute distress  HEENT: atraumatic, conjunttiva clear, no obvious abnormalities on inspection of external nose and ears  NECK: normal movements of the head and neck  LUNGS: on inspection no signs of respiratory distress, breathing rate appears normal, no obvious gross SOB, gasping or wheezing  CV: no obvious cyanosis  PSYCH/NEURO: pleasant and cooperative, no obvious depression or anxiety, speech and thought processing grossly intact  ASSESSMENT AND PLAN:  Discussed the following assessment and plan:  Problem List Items  Addressed This Visit     Sinus infection - Primary   Symptoms as outlined.  Increased pressure. Has been using flonase , neti pot and robitussin DM. Cough better. No chest congestion. Symptoms appear to be c/w sinus infection. Will add augmentin  875mg  bid as directed. Discussed taking probiotic daily while on abx and for two weeks after (or eating yogury). Continue the flonase , saline and robitussin DM. Follow.  Call with update.       Relevant Medications   amoxicillin -clavulanate (AUGMENTIN ) 875-125 MG tablet    Return if symptoms worsen or fail to improve.   I discussed the assessment and treatment plan with the patient. The patient was provided an opportunity to ask questions and all were answered. The patient agreed with the plan and demonstrated an understanding of the instructions.   The patient was advised to call back or seek an in-person evaluation if the symptoms worsen or if the condition fails to improve as anticipated.    Dellar Fenton, MD

## 2024-04-13 ENCOUNTER — Encounter: Payer: Self-pay | Admitting: Internal Medicine

## 2024-04-13 DIAGNOSIS — J329 Chronic sinusitis, unspecified: Secondary | ICD-10-CM | POA: Insufficient documentation

## 2024-04-13 NOTE — Assessment & Plan Note (Signed)
 Symptoms as outlined.  Increased pressure. Has been using flonase , neti pot and robitussin DM. Cough better. No chest congestion. Symptoms appear to be c/w sinus infection. Will add augmentin  875mg  bid as directed. Discussed taking probiotic daily while on abx and for two weeks after (or eating yogury). Continue the flonase , saline and robitussin DM. Follow.  Call with update.

## 2024-06-18 ENCOUNTER — Other Ambulatory Visit: Payer: Self-pay | Admitting: Internal Medicine

## 2024-08-04 ENCOUNTER — Encounter: Payer: Self-pay | Admitting: Internal Medicine

## 2024-09-09 ENCOUNTER — Other Ambulatory Visit: Payer: Self-pay | Admitting: Internal Medicine

## 2024-09-15 NOTE — Telephone Encounter (Signed)
 Rx ok'd for syeda .

## 2024-10-06 ENCOUNTER — Ambulatory Visit: Admitting: Internal Medicine

## 2024-10-06 VITALS — BP 118/84 | HR 81 | Temp 98.0°F | Resp 18 | Ht 63.0 in | Wt 239.0 lb

## 2024-10-06 DIAGNOSIS — Z13220 Encounter for screening for lipoid disorders: Secondary | ICD-10-CM | POA: Diagnosis not present

## 2024-10-06 DIAGNOSIS — Z0000 Encounter for general adult medical examination without abnormal findings: Secondary | ICD-10-CM | POA: Diagnosis not present

## 2024-10-06 DIAGNOSIS — I10 Essential (primary) hypertension: Secondary | ICD-10-CM

## 2024-10-06 LAB — BASIC METABOLIC PANEL WITH GFR
BUN: 14 mg/dL (ref 6–23)
CO2: 25 meq/L (ref 19–32)
Calcium: 9.6 mg/dL (ref 8.4–10.5)
Chloride: 103 meq/L (ref 96–112)
Creatinine, Ser: 0.79 mg/dL (ref 0.40–1.20)
GFR: 96.12 mL/min (ref >60.00)
Glucose, Bld: 87 mg/dL (ref 70–99)
Potassium: 4.1 meq/L (ref 3.5–5.1)
Sodium: 138 meq/L (ref 135–145)

## 2024-10-06 MED ORDER — HYDROCHLOROTHIAZIDE 12.5 MG PO CAPS: 12.5000 mg | ORAL_CAPSULE | Freq: Every day | ORAL | 1 refills | Status: AC

## 2024-10-06 MED ORDER — DROSPIRENONE-ETHINYL ESTRADIOL 3-0.03 MG PO TABS: 1.0000 | ORAL_TABLET | Freq: Every day | ORAL | 3 refills | Status: AC

## 2024-10-06 NOTE — Progress Notes (Signed)
 Subjective:    [name], female    DOB: 1986/04/05, 38 y.o.   MRN: [id number]  Patient here for  Chief Complaint  Patient presents with   Annual Exam    Doing well , no complaints - fastig    HPI Here for a physical exam. Continues on hydrochlorothiazide  for hypertension. . Saw gyn 08/30/22 - PAP. (PAP ok with negative HPV).  She is doing well. OCP's - stable. Periods regular. No spotting in between. Breathing stable. Controlling allergy symptoms with flonase . No abdominal pain or bowel change. Stress - better.    Past Medical History:  Diagnosis Date   Allergy    Hypertension    Past Surgical History:  Procedure Laterality Date   FOOT SURGERY Right    removal of broken needle from foot 2000   WRIST SURGERY Left    Broken wrist repair 2020   Family History  Problem Relation Age of Onset   Hypertension Mother        PATWPW   Breast cancer Mother 42   Skin cancer Mother    Diabetes Mellitus II Mother    Atrial fibrillation Mother    Hyperlipidemia Mother    Asthma Father        PreDiabetes   Heart disease Maternal Grandmother    Stroke Maternal Grandmother    Esophageal cancer Maternal Grandfather    Liver cancer Maternal Grandfather    Hyperlipidemia Maternal Grandfather    Hypertension Maternal Grandfather    Parkinson's disease Paternal Grandmother    Anemia Paternal Grandmother    Hypotension Paternal Grandmother    Cataracts Paternal Grandmother    Breast cancer Paternal Grandfather    Diabetes Mellitus II Paternal Grandfather    Heart disease Paternal Grandfather    Liver cancer Paternal Grandfather    Hypertension Maternal Aunt    Breast cancer Maternal Aunt 64   Ulcerative colitis Paternal Aunt    Diabetes Mellitus II Paternal Aunt    Cataracts Paternal Aunt    Anemia Paternal Aunt    Parkinson's disease Paternal Aunt    Hypertension Paternal Aunt    Hyperlipidemia Paternal Aunt    Fibroids Paternal Aunt    Other Paternal Aunt     Social History   Socioeconomic History   Marital status: Single    Spouse name: Not on file   Number of children: Not on file   Years of education: Not on file   Highest education level: Bachelor's degree (e.g., BA, AB, BS)  Occupational History   Not on file  Tobacco Use   Smoking status: Never   Smokeless tobacco: Never  Substance and Sexual Activity   Alcohol use: Yes    Alcohol/week: 1.0 standard drink of alcohol    Types: 1 Glasses of wine per week   Drug use: Never   Sexual activity: Not Currently  Other Topics Concern   Not on file  Social History Narrative   Not on file   Social Drivers of Health   Financial Resource Strain: Patient Declined (10/02/2024)   Overall Financial Resource Strain (CARDIA)    Difficulty of Paying Living Expenses: Patient declined  Food Insecurity: No Food Insecurity (10/02/2024)   Hunger Vital Sign    Worried About Running Out of Food in the Last Year: Never true    Ran Out of Food in the Last Year: Never true  Transportation Needs: No Transportation Needs (10/02/2024)   PRAPARE - Administrator, Civil Service (Medical):  No    Lack of Transportation (Non-Medical): No  Physical Activity: Insufficiently Active (10/02/2024)   Exercise Vital Sign    Days of Exercise per Week: 1 day    Minutes of Exercise per Session: 30 min  Stress: No Stress Concern Present (10/02/2024)   [employer] of Occupational Health - Occupational Stress Questionnaire    Feeling of Stress: Only a little  Social Connections: Socially Isolated (10/02/2024)   Social Connection and Isolation Panel    Frequency of Communication with Friends and Family: More than three times a week    Frequency of Social Gatherings with Friends and Family: Once a week    Attends Religious Services: Patient declined    Database Administrator or Organizations: No    Attends Engineer, Structural: Not on file    Marital Status: Never married     Review of  Systems  Constitutional:  Negative for appetite change and unexpected weight change.  HENT:  Negative for congestion, sinus pressure and sore throat.   Eyes:  Negative for pain and visual disturbance.  Respiratory:  Negative for cough, chest tightness and shortness of breath.   Cardiovascular:  Negative for chest pain, palpitations and leg swelling.  Gastrointestinal:  Negative for abdominal pain, diarrhea, nausea and vomiting.  Genitourinary:  Negative for difficulty urinating and dysuria.  Musculoskeletal:  Negative for joint swelling and myalgias.  Skin:  Negative for color change and rash.  Neurological:  Negative for dizziness and headaches.  Hematological:  Negative for adenopathy. Does not bruise/bleed easily.  Psychiatric/Behavioral:  Negative for agitation and dysphoric mood.        Objective:     BP 118/84   Pulse 81   Temp 98 F (36.7 C)   Resp 18   Ht 5' 3 (1.6 m)   Wt 239 lb (108.4 kg)   LMP 09/18/2024   SpO2 98%   BMI 42.34 kg/m  Wt Readings from Last 3 Encounters:  10/06/24 239 lb (108.4 kg)  04/03/24 240 lb (108.9 kg)  10/05/23 243 lb (110.2 kg)    Physical Exam Vitals reviewed.  Constitutional:      General: She is not in acute distress.    Appearance: Normal appearance. She is well-developed.  HENT:     Head: Normocephalic and atraumatic.     Right Ear: External ear normal.     Left Ear: External ear normal.     Mouth/Throat:     Pharynx: No oropharyngeal exudate or posterior oropharyngeal erythema.  Eyes:     General: No scleral icterus.       Right eye: No discharge.        Left eye: No discharge.     Conjunctiva/sclera: Conjunctivae normal.  Neck:     Thyroid : No thyromegaly.  Cardiovascular:     Rate and Rhythm: Normal rate and regular rhythm.  Pulmonary:     Effort: No tachypnea, accessory muscle usage or respiratory distress.     Breath sounds: Normal breath sounds. No decreased breath sounds or wheezing.  Chest:  Breasts:     Right: No inverted nipple, mass, nipple discharge or tenderness (no axillary adenopathy).     Left: No inverted nipple, mass, nipple discharge or tenderness (no axilarry adenopathy).  Abdominal:     General: Bowel sounds are normal.     Palpations: Abdomen is soft.     Tenderness: There is no abdominal tenderness.  Musculoskeletal:        General: No swelling or  tenderness.     Cervical back: Neck supple.  Lymphadenopathy:     Cervical: No cervical adenopathy.  Skin:    Findings: No erythema or rash.  Neurological:     Mental Status: She is alert and oriented to person, place, and time.  Psychiatric:        Mood and Affect: Mood normal.        Behavior: Behavior normal.         Outpatient Encounter Medications as of 10/06/2024  Medication Sig   drospirenone -ethinyl estradiol  (SYEDA ) 3-0.03 MG tablet Take 1 tablet by mouth daily.   fluticasone  (FLONASE ) 50 MCG/ACT nasal spray Place 2 sprays into both nostrils daily.   hydrochlorothiazide  (MICROZIDE ) 12.5 MG capsule Take 1 capsule (12.5 mg total) by mouth daily.   loratadine (CLARITIN) 10 MG tablet Take 10 mg by mouth daily.   [DISCONTINUED] amoxicillin -clavulanate (AUGMENTIN ) 875-125 MG tablet Take 1 tablet by mouth 2 (two) times daily.   [DISCONTINUED] drospirenone -ethinyl estradiol  (SYEDA ) 3-0.03 MG tablet TAKE 1 TABLET BY MOUTH EVERY DAY   [DISCONTINUED] hydrochlorothiazide  (MICROZIDE ) 12.5 MG capsule TAKE 1 CAPSULE BY MOUTH EVERY DAY   No facility-administered encounter medications on file as of 10/06/2024.     Lab Results  Component Value Date   WBC 10.4 10/05/2023   HGB 13.1 10/05/2023   HCT 40.5 10/05/2023   PLT 373.0 10/05/2023   GLUCOSE 87 10/06/2024   CHOL 176 06/04/2023   TRIG 104.0 06/04/2023   HDL 61.30 06/04/2023   LDLCALC 94 06/04/2023   ALT 9 10/05/2023   AST 11 10/05/2023   NA 138 10/06/2024   K 4.1 10/06/2024   CL 103 10/06/2024   CREATININE 0.79 10/06/2024   BUN 14 10/06/2024   CO2 25  10/06/2024   TSH 2.83 10/05/2023   HGBA1C 5.3 04/14/2021       Assessment & Plan:  Routine general medical examination at a health care facility  Hypertension, unspecified type Assessment & Plan: Blood pressure as outlined. On hydrochlorothiazide .  Tolerating.  Follow pressures.  Check metabolic panel today.   Orders: -     Basic metabolic panel with GFR  Screening cholesterol level  Healthcare maintenance Assessment & Plan: Physical today. GYN - PAP - 08/30/22 - ok with negative HPV. Diagnostic mammogram 05/2020 - [facility].    Severe obesity (BMI >= 40) (HCC) Assessment & Plan: BMI 42 with associated hypertension. Has lost some weight. Diet and exercise.    Other orders -     Drospirenone -Ethinyl Estradiol ; Take 1 tablet by mouth daily.  Dispense: 84 tablet; Refill: 3 -     hydroCHLOROthiazide ; Take 1 capsule (12.5 mg total) by mouth daily.  Dispense: 90 capsule; Refill: 1     Allena Hamilton, MD

## 2024-10-06 NOTE — Assessment & Plan Note (Signed)
 Physical today. GYN - PAP - 08/30/22 - ok with negative HPV. Diagnostic mammogram 05/2020 - [facility].

## 2024-10-07 ENCOUNTER — Ambulatory Visit: Payer: Self-pay | Admitting: Internal Medicine

## 2024-10-12 ENCOUNTER — Encounter: Payer: Self-pay | Admitting: Internal Medicine

## 2024-10-12 NOTE — Assessment & Plan Note (Signed)
 BMI 42 with associated hypertension. Has lost some weight. Diet and exercise.

## 2024-10-12 NOTE — Assessment & Plan Note (Signed)
 Blood pressure as outlined. On hydrochlorothiazide .  Tolerating.  Follow pressures.  Check metabolic panel today.

## 2025-04-08 ENCOUNTER — Ambulatory Visit: Admitting: Internal Medicine
